# Patient Record
Sex: Male | Born: 1970 | Race: White | Hispanic: No | Marital: Married | Smoking: Never smoker
Health system: Southern US, Community
[De-identification: ages and names within clinical notes are randomized; demographics above are authoritative.]

## PROBLEM LIST (undated history)

## (undated) ENCOUNTER — Emergency Department (HOSPITAL_COMMUNITY): Admission: EM | Payer: Federal, State, Local not specified - PPO | Source: Home / Self Care

---

## 2016-05-16 DIAGNOSIS — K080 Exfoliation of teeth due to systemic causes: Secondary | ICD-10-CM | POA: Diagnosis not present

## 2016-06-12 DIAGNOSIS — Z7189 Other specified counseling: Secondary | ICD-10-CM | POA: Diagnosis not present

## 2016-06-12 DIAGNOSIS — Z23 Encounter for immunization: Secondary | ICD-10-CM | POA: Diagnosis not present

## 2016-10-13 DIAGNOSIS — Z0000 Encounter for general adult medical examination without abnormal findings: Secondary | ICD-10-CM | POA: Diagnosis not present

## 2016-10-13 DIAGNOSIS — E782 Mixed hyperlipidemia: Secondary | ICD-10-CM | POA: Diagnosis not present

## 2016-11-23 DIAGNOSIS — K080 Exfoliation of teeth due to systemic causes: Secondary | ICD-10-CM | POA: Diagnosis not present

## 2017-07-16 DIAGNOSIS — K080 Exfoliation of teeth due to systemic causes: Secondary | ICD-10-CM | POA: Diagnosis not present

## 2017-10-15 DIAGNOSIS — Z13220 Encounter for screening for lipoid disorders: Secondary | ICD-10-CM | POA: Diagnosis not present

## 2017-10-15 DIAGNOSIS — Z0000 Encounter for general adult medical examination without abnormal findings: Secondary | ICD-10-CM | POA: Diagnosis not present

## 2018-02-25 DIAGNOSIS — D23121 Other benign neoplasm of skin of left upper eyelid, including canthus: Secondary | ICD-10-CM | POA: Diagnosis not present

## 2018-02-25 DIAGNOSIS — H2513 Age-related nuclear cataract, bilateral: Secondary | ICD-10-CM | POA: Diagnosis not present

## 2018-03-29 DIAGNOSIS — D23121 Other benign neoplasm of skin of left upper eyelid, including canthus: Secondary | ICD-10-CM | POA: Diagnosis not present

## 2018-03-29 DIAGNOSIS — R238 Other skin changes: Secondary | ICD-10-CM | POA: Diagnosis not present

## 2018-03-29 DIAGNOSIS — H11442 Conjunctival cysts, left eye: Secondary | ICD-10-CM | POA: Diagnosis not present

## 2018-04-05 DIAGNOSIS — D23121 Other benign neoplasm of skin of left upper eyelid, including canthus: Secondary | ICD-10-CM | POA: Diagnosis not present

## 2018-07-15 DIAGNOSIS — K080 Exfoliation of teeth due to systemic causes: Secondary | ICD-10-CM | POA: Diagnosis not present

## 2018-10-16 DIAGNOSIS — Z0000 Encounter for general adult medical examination without abnormal findings: Secondary | ICD-10-CM | POA: Diagnosis not present

## 2018-10-16 DIAGNOSIS — E782 Mixed hyperlipidemia: Secondary | ICD-10-CM | POA: Diagnosis not present

## 2018-10-16 DIAGNOSIS — Z131 Encounter for screening for diabetes mellitus: Secondary | ICD-10-CM | POA: Diagnosis not present

## 2018-10-16 DIAGNOSIS — Z125 Encounter for screening for malignant neoplasm of prostate: Secondary | ICD-10-CM | POA: Diagnosis not present

## 2018-10-16 DIAGNOSIS — Z13220 Encounter for screening for lipoid disorders: Secondary | ICD-10-CM | POA: Diagnosis not present

## 2020-06-30 DIAGNOSIS — D3131 Benign neoplasm of right choroid: Secondary | ICD-10-CM | POA: Diagnosis not present

## 2020-10-30 ENCOUNTER — Encounter (HOSPITAL_COMMUNITY)
Admission: EM | Disposition: A | Discharge status: Home Health | Payer: Self-pay | Source: Home / Self Care | Attending: Orthopedic Surgery

## 2020-10-30 ENCOUNTER — Emergency Department (HOSPITAL_COMMUNITY): Payer: Federal, State, Local not specified - PPO

## 2020-10-30 ENCOUNTER — Encounter (HOSPITAL_COMMUNITY): Payer: Self-pay

## 2020-10-30 ENCOUNTER — Inpatient Hospital Stay (HOSPITAL_COMMUNITY): Payer: Federal, State, Local not specified - PPO

## 2020-10-30 ENCOUNTER — Emergency Department (HOSPITAL_COMMUNITY): Payer: Federal, State, Local not specified - PPO | Admitting: Anesthesiology

## 2020-10-30 ENCOUNTER — Inpatient Hospital Stay (HOSPITAL_COMMUNITY)
Admission: EM | Admit: 2020-10-30 | Discharge: 2020-11-05 | DRG: 488 | Disposition: A | Discharge status: Home Health | Payer: Federal, State, Local not specified - PPO | Attending: Orthopedic Surgery | Admitting: Orthopedic Surgery

## 2020-10-30 ENCOUNTER — Other Ambulatory Visit: Payer: Self-pay

## 2020-10-30 DIAGNOSIS — Y9339 Activity, other involving climbing, rappelling and jumping off: Secondary | ICD-10-CM

## 2020-10-30 DIAGNOSIS — S82142A Displaced bicondylar fracture of left tibia, initial encounter for closed fracture: Secondary | ICD-10-CM | POA: Diagnosis present

## 2020-10-30 DIAGNOSIS — T148XXA Other injury of unspecified body region, initial encounter: Secondary | ICD-10-CM

## 2020-10-30 DIAGNOSIS — Z20822 Contact with and (suspected) exposure to covid-19: Secondary | ICD-10-CM | POA: Diagnosis present

## 2020-10-30 DIAGNOSIS — D62 Acute posthemorrhagic anemia: Secondary | ICD-10-CM | POA: Diagnosis not present

## 2020-10-30 DIAGNOSIS — S83289A Other tear of lateral meniscus, current injury, unspecified knee, initial encounter: Secondary | ICD-10-CM | POA: Diagnosis present

## 2020-10-30 DIAGNOSIS — S83122A Posterior subluxation of proximal end of tibia, left knee, initial encounter: Secondary | ICD-10-CM | POA: Diagnosis not present

## 2020-10-30 DIAGNOSIS — E8889 Other specified metabolic disorders: Secondary | ICD-10-CM | POA: Diagnosis present

## 2020-10-30 DIAGNOSIS — R000 Tachycardia, unspecified: Secondary | ICD-10-CM | POA: Diagnosis not present

## 2020-10-30 DIAGNOSIS — R339 Retention of urine, unspecified: Secondary | ICD-10-CM | POA: Diagnosis not present

## 2020-10-30 DIAGNOSIS — I10 Essential (primary) hypertension: Secondary | ICD-10-CM

## 2020-10-30 DIAGNOSIS — S99912A Unspecified injury of left ankle, initial encounter: Secondary | ICD-10-CM | POA: Diagnosis not present

## 2020-10-30 DIAGNOSIS — W010XXA Fall on same level from slipping, tripping and stumbling without subsequent striking against object, initial encounter: Secondary | ICD-10-CM | POA: Diagnosis present

## 2020-10-30 DIAGNOSIS — S83282A Other tear of lateral meniscus, current injury, left knee, initial encounter: Secondary | ICD-10-CM | POA: Diagnosis not present

## 2020-10-30 DIAGNOSIS — W19XXXA Unspecified fall, initial encounter: Secondary | ICD-10-CM | POA: Diagnosis not present

## 2020-10-30 DIAGNOSIS — E559 Vitamin D deficiency, unspecified: Secondary | ICD-10-CM | POA: Diagnosis not present

## 2020-10-30 DIAGNOSIS — Z01818 Encounter for other preprocedural examination: Secondary | ICD-10-CM | POA: Diagnosis not present

## 2020-10-30 DIAGNOSIS — R338 Other retention of urine: Secondary | ICD-10-CM | POA: Diagnosis not present

## 2020-10-30 DIAGNOSIS — S82142D Displaced bicondylar fracture of left tibia, subsequent encounter for closed fracture with routine healing: Secondary | ICD-10-CM | POA: Diagnosis not present

## 2020-10-30 DIAGNOSIS — Z9889 Other specified postprocedural states: Secondary | ICD-10-CM

## 2020-10-30 DIAGNOSIS — Z419 Encounter for procedure for purposes other than remedying health state, unspecified: Secondary | ICD-10-CM

## 2020-10-30 DIAGNOSIS — S82112A Displaced fracture of left tibial spine, initial encounter for closed fracture: Secondary | ICD-10-CM | POA: Diagnosis not present

## 2020-10-30 HISTORY — PX: EXTERNAL FIXATION LEG: SHX1549

## 2020-10-30 LAB — CBC WITH DIFFERENTIAL/PLATELET
Abs Immature Granulocytes: 0.04 10*3/uL (ref 0.00–0.07)
Basophils Absolute: 0.1 10*3/uL (ref 0.0–0.1)
Basophils Relative: 0 %
Eosinophils Absolute: 0 10*3/uL (ref 0.0–0.5)
Eosinophils Relative: 0 %
HCT: 45.6 % (ref 39.0–52.0)
Hemoglobin: 15.6 g/dL (ref 13.0–17.0)
Immature Granulocytes: 0 %
Lymphocytes Relative: 13 %
Lymphs Abs: 1.8 10*3/uL (ref 0.7–4.0)
MCH: 29.1 pg (ref 26.0–34.0)
MCHC: 34.2 g/dL (ref 30.0–36.0)
MCV: 85.1 fL (ref 80.0–100.0)
Monocytes Absolute: 0.6 10*3/uL (ref 0.1–1.0)
Monocytes Relative: 5 %
Neutro Abs: 10.8 10*3/uL — ABNORMAL HIGH (ref 1.7–7.7)
Neutrophils Relative %: 82 %
Platelets: 256 10*3/uL (ref 150–400)
RBC: 5.36 MIL/uL (ref 4.22–5.81)
RDW: 12.7 % (ref 11.5–15.5)
WBC: 13.3 10*3/uL — ABNORMAL HIGH (ref 4.0–10.5)
nRBC: 0 % (ref 0.0–0.2)

## 2020-10-30 LAB — BASIC METABOLIC PANEL
Anion gap: 12 (ref 5–15)
BUN: 15 mg/dL (ref 6–20)
CO2: 26 mmol/L (ref 22–32)
Calcium: 9 mg/dL (ref 8.9–10.3)
Chloride: 99 mmol/L (ref 98–111)
Creatinine, Ser: 0.92 mg/dL (ref 0.61–1.24)
GFR, Estimated: >60 mL/min (ref >60)
Glucose, Bld: 139 mg/dL — ABNORMAL HIGH (ref 70–99)
Potassium: 4 mmol/L (ref 3.5–5.1)
Sodium: 137 mmol/L (ref 135–145)

## 2020-10-30 LAB — RESP PANEL BY RT-PCR (FLU A&B, COVID) ARPGX2
Influenza A by PCR: NEGATIVE
Influenza B by PCR: NEGATIVE
SARS Coronavirus 2 by RT PCR: NEGATIVE

## 2020-10-30 SURGERY — EXTERNAL FIXATION, LOWER EXTREMITY
Anesthesia: General | Laterality: Left

## 2020-10-30 MED ORDER — DEXAMETHASONE SODIUM PHOSPHATE 10 MG/ML IJ SOLN
INTRAMUSCULAR | Status: DC | PRN
  Administered 2020-10-30: 5 mg via INTRAVENOUS

## 2020-10-30 MED ORDER — MIDAZOLAM HCL 5 MG/5ML IJ SOLN
INTRAMUSCULAR | Status: DC | PRN
  Administered 2020-10-30: 2 mg via INTRAVENOUS

## 2020-10-30 MED ORDER — MORPHINE SULFATE (PF) 4 MG/ML IV SOLN
4.0000 mg | Freq: Once | INTRAVENOUS | Status: AC | PRN
Start: 2020-10-30 — End: 2020-10-30
  Administered 2020-10-30: 4 mg via INTRAVENOUS
  Filled 2020-10-30: qty 1

## 2020-10-30 MED ORDER — SUCCINYLCHOLINE CHLORIDE 20 MG/ML IJ SOLN
INTRAMUSCULAR | Status: DC | PRN
  Administered 2020-10-30: 120 mg via INTRAVENOUS

## 2020-10-30 MED ORDER — FENTANYL CITRATE (PF) 100 MCG/2ML IJ SOLN
INTRAMUSCULAR | Status: DC | PRN
  Administered 2020-10-30 (×5): 50 ug via INTRAVENOUS
  Administered 2020-10-31: 25 ug via INTRAVENOUS
  Administered 2020-10-31: 50 ug via INTRAVENOUS
  Administered 2020-10-31: 25 ug via INTRAVENOUS

## 2020-10-30 MED ORDER — FENTANYL CITRATE (PF) 100 MCG/2ML IJ SOLN: 25.0000 ug | INTRAMUSCULAR | Status: DC | PRN

## 2020-10-30 MED ORDER — FENTANYL CITRATE (PF) 100 MCG/2ML IJ SOLN
50.0000 ug | Freq: Once | INTRAMUSCULAR | Status: AC
  Administered 2020-10-30: 50 ug via INTRAVENOUS
  Filled 2020-10-30: qty 2

## 2020-10-30 MED ORDER — PROPOFOL 10 MG/ML IV BOLUS
INTRAVENOUS | Status: DC | PRN
  Administered 2020-10-30: 200 mg via INTRAVENOUS

## 2020-10-30 MED ORDER — FENTANYL CITRATE (PF) 250 MCG/5ML IJ SOLN
INTRAMUSCULAR | Status: AC
  Filled 2020-10-30: qty 5

## 2020-10-30 MED ORDER — ROCURONIUM BROMIDE 10 MG/ML (PF) SYRINGE
PREFILLED_SYRINGE | INTRAVENOUS | Status: DC | PRN
  Administered 2020-10-30: 60 mg via INTRAVENOUS

## 2020-10-30 MED ORDER — OXYCODONE HCL 5 MG/5ML PO SOLN: 5.0000 mg | Freq: Once | ORAL | Status: DC | PRN

## 2020-10-30 MED ORDER — PROMETHAZINE HCL 25 MG/ML IJ SOLN: 6.2500 mg | INTRAMUSCULAR | Status: DC | PRN

## 2020-10-30 MED ORDER — KETOROLAC TROMETHAMINE 30 MG/ML IJ SOLN: 30.0000 mg | Freq: Once | INTRAMUSCULAR | Status: DC

## 2020-10-30 MED ORDER — PROPOFOL 10 MG/ML IV BOLUS
INTRAVENOUS | Status: AC
  Filled 2020-10-30: qty 20

## 2020-10-30 MED ORDER — LIDOCAINE 2% (20 MG/ML) 5 ML SYRINGE
INTRAMUSCULAR | Status: DC | PRN
  Administered 2020-10-30: 60 mg via INTRAVENOUS

## 2020-10-30 MED ORDER — OXYCODONE HCL 5 MG PO TABS: 5.0000 mg | ORAL_TABLET | Freq: Once | ORAL | Status: DC | PRN

## 2020-10-30 MED ORDER — MIDAZOLAM HCL 2 MG/2ML IJ SOLN
INTRAMUSCULAR | Status: AC
  Filled 2020-10-30: qty 2

## 2020-10-30 MED ORDER — DEXMEDETOMIDINE (PRECEDEX) IN NS 20 MCG/5ML (4 MCG/ML) IV SYRINGE
PREFILLED_SYRINGE | INTRAVENOUS | Status: DC | PRN
  Administered 2020-10-30: 12 ug via INTRAVENOUS
  Administered 2020-10-30: 8 ug via INTRAVENOUS

## 2020-10-30 MED ORDER — ONDANSETRON HCL 4 MG/2ML IJ SOLN
INTRAMUSCULAR | Status: DC | PRN
  Administered 2020-10-30: 4 mg via INTRAVENOUS

## 2020-10-30 MED ORDER — CEFAZOLIN SODIUM-DEXTROSE 2-4 GM/100ML-% IV SOLN
2.0000 g | INTRAVENOUS | Status: AC
  Administered 2020-10-30: 2 g via INTRAVENOUS
  Filled 2020-10-30: qty 100

## 2020-10-30 MED ORDER — ACETAMINOPHEN 10 MG/ML IV SOLN: 1000.0000 mg | Freq: Once | INTRAVENOUS | Status: DC | PRN

## 2020-10-30 MED ORDER — SUGAMMADEX SODIUM 200 MG/2ML IV SOLN
INTRAVENOUS | Status: DC | PRN
  Administered 2020-10-30: 200 mg via INTRAVENOUS

## 2020-10-30 MED ORDER — LACTATED RINGERS IV SOLN: INTRAVENOUS | Status: DC | PRN

## 2020-10-30 SURGICAL SUPPLY — 46 items
BANDAGE ESMARK 6X9 LF (GAUZE/BANDAGES/DRESSINGS) ×1 IMPLANT
BAR GLASS FIBER EXFX 11X500 (EXFIX) ×4 IMPLANT
BNDG COHESIVE 6X5 TAN STRL LF (GAUZE/BANDAGES/DRESSINGS) IMPLANT
BNDG ELASTIC 4X5.8 VLCR STR LF (GAUZE/BANDAGES/DRESSINGS) ×2 IMPLANT
BNDG ELASTIC 6X15 VLCR STRL LF (GAUZE/BANDAGES/DRESSINGS) ×2 IMPLANT
BNDG ELASTIC 6X5.8 VLCR STR LF (GAUZE/BANDAGES/DRESSINGS) ×2 IMPLANT
BNDG ESMARK 6X9 LF (GAUZE/BANDAGES/DRESSINGS) ×2
BNDG GAUZE ELAST 4 BULKY (GAUZE/BANDAGES/DRESSINGS) ×4 IMPLANT
BRUSH SCRUB EZ PLAIN DRY (MISCELLANEOUS) ×4 IMPLANT
COVER SURGICAL LIGHT HANDLE (MISCELLANEOUS) ×4 IMPLANT
DRAPE C-ARM 42X72 X-RAY (DRAPES) ×2 IMPLANT
DRAPE C-ARMOR (DRAPES) ×2 IMPLANT
DRAPE U-SHAPE 47X51 STRL (DRAPES) ×2 IMPLANT
DRSG ADAPTIC 3X8 NADH LF (GAUZE/BANDAGES/DRESSINGS) ×2 IMPLANT
ELECT REM PT RETURN 9FT ADLT (ELECTROSURGICAL) ×2
ELECTRODE REM PT RTRN 9FT ADLT (ELECTROSURGICAL) ×1 IMPLANT
EVACUATOR 1/8 PVC DRAIN (DRAIN) IMPLANT
GAUZE SPONGE 4X4 12PLY STRL (GAUZE/BANDAGES/DRESSINGS) ×2 IMPLANT
GLOVE BIO SURGEON STRL SZ7.5 (GLOVE) ×2 IMPLANT
GLOVE BIO SURGEON STRL SZ8 (GLOVE) ×2 IMPLANT
GLOVE BIOGEL PI IND STRL 7.5 (GLOVE) ×1 IMPLANT
GLOVE BIOGEL PI INDICATOR 7.5 (GLOVE) ×1
GLOVE SRG 8 PF TXTR STRL LF DI (GLOVE) ×1 IMPLANT
GLOVE SURG UNDER POLY LF SZ8 (GLOVE) ×1
GOWN STRL REUS W/ TWL LRG LVL3 (GOWN DISPOSABLE) ×2 IMPLANT
GOWN STRL REUS W/ TWL XL LVL3 (GOWN DISPOSABLE) ×1 IMPLANT
GOWN STRL REUS W/TWL LRG LVL3 (GOWN DISPOSABLE) ×2
GOWN STRL REUS W/TWL XL LVL3 (GOWN DISPOSABLE) ×1
KIT BASIN OR (CUSTOM PROCEDURE TRAY) ×2 IMPLANT
KIT TURNOVER KIT B (KITS) ×2 IMPLANT
MANIFOLD NEPTUNE II (INSTRUMENTS) ×2 IMPLANT
NEEDLE 18GX1X1/2 (RX/OR ONLY) (NEEDLE) ×2 IMPLANT
NEEDLE 22X1 1/2 (OR ONLY) (NEEDLE) IMPLANT
NS IRRIG 1000ML POUR BTL (IV SOLUTION) ×2 IMPLANT
PACK ORTHO EXTREMITY (CUSTOM PROCEDURE TRAY) ×2 IMPLANT
PAD ARMBOARD 7.5X6 YLW CONV (MISCELLANEOUS) ×4 IMPLANT
PAD CAST 4YDX4 CTTN HI CHSV (CAST SUPPLIES) ×1 IMPLANT
PADDING CAST COTTON 4X4 STRL (CAST SUPPLIES) ×1
PADDING CAST COTTON 6X4 STRL (CAST SUPPLIES) ×4 IMPLANT
PIN CLAMP 2BAR 75MM BLUE (EXFIX) ×4 IMPLANT
PIN HALF YELLOW 5X160X35 (EXFIX) ×8 IMPLANT
SPONGE LAP 18X18 RF (DISPOSABLE) ×2 IMPLANT
SYR 20CC LL (SYRINGE) ×2 IMPLANT
TOWEL GREEN STERILE (TOWEL DISPOSABLE) ×4 IMPLANT
TOWEL GREEN STERILE FF (TOWEL DISPOSABLE) ×4 IMPLANT
UNDERPAD 30X36 HEAVY ABSORB (UNDERPADS AND DIAPERS) ×4 IMPLANT

## 2020-10-30 NOTE — Anesthesia Preprocedure Evaluation (Addendum)
Anesthesia Evaluation  Patient identified by MRN, date of birth, ID band Patient awake    Reviewed: Allergy & Precautions, NPO status , Patient's Chart, lab work & pertinent test results  Airway Mallampati: II  TM Distance: >3 FB Neck ROM: Full    Dental no notable dental hx.    Pulmonary neg pulmonary ROS,    Pulmonary exam normal breath sounds clear to auscultation       Cardiovascular negative cardio ROS Normal cardiovascular exam Rhythm:Regular Rate:Normal     Neuro/Psych negative neurological ROS  negative psych ROS   GI/Hepatic negative GI ROS, Neg liver ROS,   Endo/Other  negative endocrine ROS  Renal/GU negative Renal ROS     Musculoskeletal negative musculoskeletal ROS (+)   Abdominal   Peds  Hematology negative hematology ROS (+)   Anesthesia Other Findings LEFT KNEE PAIN  Reproductive/Obstetrics                            Anesthesia Physical Anesthesia Plan  ASA: II  Anesthesia Plan: General   Post-op Pain Management:    Induction: Intravenous  PONV Risk Score and Plan: 2 and Ondansetron, Dexamethasone, Midazolam and Treatment may vary due to age or medical condition  Airway Management Planned: Oral ETT  Additional Equipment:   Intra-op Plan:   Post-operative Plan: Extubation in OR  Informed Consent: I have reviewed the patients History and Physical, chart, labs and discussed the procedure including the risks, benefits and alternatives for the proposed anesthesia with the patient or authorized representative who has indicated his/her understanding and acceptance.     Dental advisory given  Plan Discussed with: CRNA  Anesthesia Plan Comments:         Anesthesia Quick Evaluation

## 2020-10-30 NOTE — H&P (Signed)
[facility] H&P/Consult     Patient ID: Antonio Harrison MRN: [id number] DOB/AGE: Sep 06, 1970 50 y.o.  Chief Complaint: Left tibial plateau fracture with joint subluxation HPI: Antonio Harrison is an 50 y.o. male. Who fell going to [facility] concert general admission seating after jumping a guardrail with acute pain and swelling, but no paresthesia. No other injury. Fentanyl helped, mild reaction to morphine upon administration with flushing but then it resolved. In knee immobilizer. No history of other knee injury.  History reviewed. No past medical history. Borderline hypertension unmedicated.  History reviewed. No past surgical history.  History reviewed. No pertinent family history. Social History:  reports that he has never smoked. He has never used smokeless tobacco. No history on file for alcohol use and drug use. Works for [employer], has been able to do so remotely for the last two years.  Allergies: No Known Allergies  Meds: None  Results for orders placed or performed during the hospital encounter of 10/30/20 (from the past 48 hour(s))  Basic metabolic panel     Status: Abnormal   Collection Time: 10/30/20  6:32 PM  Result Value Ref Range   Sodium 137 135 - 145 mmol/L   Potassium 4.0 3.5 - 5.1 mmol/L   Chloride 99 98 - 111 mmol/L   CO2 26 22 - 32 mmol/L   Glucose, Bld 139 (H) 70 - 99 mg/dL    Comment: Glucose reference range applies only to samples taken after fasting for at least 8 hours.   BUN 15 6 - 20 mg/dL   Creatinine, Ser 4.43 0.61 - 1.24 mg/dL   Calcium 9.0 8.9 - 15.4 mg/dL   GFR, Estimated >00 >86 mL/min    Comment: (NOTE) Calculated using the CKD-EPI Creatinine Equation (2021)    Anion gap 12 5 - 15    Comment: Performed at [facility], [address], [place], [place] [address]  CBC with Differential     Status: Abnormal   Collection Time: 10/30/20  6:32 PM  Result Value Ref Range   WBC 13.3 (H)  4.0 - 10.5 K/uL   RBC 5.36 4.22 - 5.81 MIL/uL   Hemoglobin 15.6 13.0 - 17.0 g/dL   HCT 09.3 26.7 - 12.4 %   MCV 85.1 80.0 - 100.0 fL   MCH 29.1 26.0 - 34.0 pg   MCHC 34.2 30.0 - 36.0 g/dL   RDW 58.0 99.8 - 33.8 %   Platelets 256 150 - 400 K/uL   nRBC 0.0 0.0 - 0.2 %   Neutrophils Relative % 82 %   Neutro Abs 10.8 (H) 1.7 - 7.7 K/uL   Lymphocytes Relative 13 %   Lymphs Abs 1.8 0.7 - 4.0 K/uL   Monocytes Relative 5 %   Monocytes Absolute 0.6 0.1 - 1.0 K/uL   Eosinophils Relative 0 %   Eosinophils Absolute 0.0 0.0 - 0.5 K/uL   Basophils Relative 0 %   Basophils Absolute 0.1 0.0 - 0.1 K/uL   Immature Granulocytes 0 %   Abs Immature Granulocytes 0.04 0.00 - 0.07 K/uL    Comment: Performed at [facility], [address] [place], [place] [address]  Resp Panel by RT-PCR (Flu A&B, Covid) Nasopharyngeal Swab     Status: None   Collection Time: 10/30/20  6:32 PM   Specimen: Nasopharyngeal Swab; Nasopharyngeal(NP) swabs in vial transport medium  Result Value Ref Range   SARS Coronavirus 2 by RT PCR NEGATIVE NEGATIVE  Comment: (NOTE) SARS-CoV-2 target nucleic acids are NOT DETECTED.  The SARS-CoV-2 RNA is generally detectable in upper respiratory specimens during the acute phase of infection. The lowest concentration of SARS-CoV-2 viral copies this assay can detect is 138 copies/mL. A negative result does not preclude SARS-Cov-2 infection and should not be used as the sole basis for treatment or other patient management decisions. A negative result may occur with  improper specimen collection/handling, submission of specimen other than nasopharyngeal swab, presence of viral mutation(s) within the areas targeted by this assay, and inadequate number of viral copies(<138 copies/mL). A negative result must be combined with clinical observations, patient history, and epidemiological information. The expected result is Negative.  Fact Sheet for Patients:   BloggerCourse.com  Fact Sheet for Healthcare Providers:  SeriousBroker.it  This test is no t yet approved or cleared by the [place] FDA and  has been authorized for detection and/or diagnosis of SARS-CoV-2 by FDA under an Emergency Use Authorization (EUA). This EUA will remain  in effect (meaning this test can be used) for the duration of the COVID-19 declaration under Section 564(b)(1) of the Act, 21 U.S.C.section 360bbb-3(b)(1), unless the authorization is terminated  or revoked sooner.       Influenza A by PCR NEGATIVE NEGATIVE   Influenza B by PCR NEGATIVE NEGATIVE    Comment: (NOTE) The Xpert Xpress SARS-CoV-2/FLU/RSV plus assay is intended as an aid in the diagnosis of influenza from Nasopharyngeal swab specimens and should not be used as a sole basis for treatment. Nasal washings and aspirates are unacceptable for Xpert Xpress SARS-CoV-2/FLU/RSV testing.  Fact Sheet for Patients: BloggerCourse.com  Fact Sheet for Healthcare Providers: SeriousBroker.it  This test is not yet approved or cleared by the [place] FDA and has been authorized for detection and/or diagnosis of SARS-CoV-2 by FDA under an Emergency Use Authorization (EUA). This EUA will remain in effect (meaning this test can be used) for the duration of the COVID-19 declaration under Section 564(b)(1) of the Act, 21 U.S.C. section 360bbb-3(b)(1), unless the authorization is terminated or revoked.  Performed at [facility], [address], [place], [place] [address]    DG Chest 1 View  Result Date: 10/30/2020 CLINICAL DATA:  Preop EXAM: CHEST  1 VIEW COMPARISON:  None. FINDINGS: The cardiomediastinal silhouette is normal in contour. No pleural effusion. No pneumothorax. No acute pleuroparenchymal abnormality. Visualized abdomen is unremarkable. No acute osseous abnormality noted.  IMPRESSION: No acute cardiopulmonary abnormality. Electronically Signed   By: Meda Klinefelter MD   On: 10/30/2020 18:36   DG Tibia/Fibula Left  Result Date: 10/30/2020 CLINICAL DATA:  lept over guardrail and dropped approx 5 feet and fell onto left leg. EXAM: LEFT KNEE - COMPLETE 4+ VIEW; LEFT ANKLE COMPLETE - 3+ VIEW; LEFT TIBIA AND FIBULA - 2 VIEW COMPARISON:  None. FINDINGS: Left knee/tibia and fibula: Markedly comminuted lateral tibial plateau fracture extending to the intercondylar eminence and medial condyle. Associated posterolaterally displaced fracture that extends to the lateral metadiaphysis of the tibia. Associated lipohemarthrosis. Left ankle: No evidence of fracture, dislocation, or joint effusion. No evidence of arthropathy or other focal bone abnormality. Soft tissues are unremarkable. IMPRESSION: 1. Markedly comminuted and displaced tibial plateau fracture involving bilateral tibial condyles and the intercondylar eminence. 2. No acute displaced fracture or dislocation of the distal left tibia fibula and bones of the ankle. Electronically Signed   By: Tish Frederickson M.D.   On: 10/30/2020 17:48   DG Ankle Complete Left  Result Date:  10/30/2020 CLINICAL DATA:  lept over guardrail and dropped approx 5 feet and fell onto left leg. EXAM: LEFT KNEE - COMPLETE 4+ VIEW; LEFT ANKLE COMPLETE - 3+ VIEW; LEFT TIBIA AND FIBULA - 2 VIEW COMPARISON:  None. FINDINGS: Left knee/tibia and fibula: Markedly comminuted lateral tibial plateau fracture extending to the intercondylar eminence and medial condyle. Associated posterolaterally displaced fracture that extends to the lateral metadiaphysis of the tibia. Associated lipohemarthrosis. Left ankle: No evidence of fracture, dislocation, or joint effusion. No evidence of arthropathy or other focal bone abnormality. Soft tissues are unremarkable. IMPRESSION: 1. Markedly comminuted and displaced tibial plateau fracture involving bilateral tibial condyles and  the intercondylar eminence. 2. No acute displaced fracture or dislocation of the distal left tibia fibula and bones of the ankle. Electronically Signed   By: Tish Frederickson M.D.   On: 10/30/2020 17:48   DG Knee Complete 4 Views Left  Result Date: 10/30/2020 CLINICAL DATA:  lept over guardrail and dropped approx 5 feet and fell onto left leg. EXAM: LEFT KNEE - COMPLETE 4+ VIEW; LEFT ANKLE COMPLETE - 3+ VIEW; LEFT TIBIA AND FIBULA - 2 VIEW COMPARISON:  None. FINDINGS: Left knee/tibia and fibula: Markedly comminuted lateral tibial plateau fracture extending to the intercondylar eminence and medial condyle. Associated posterolaterally displaced fracture that extends to the lateral metadiaphysis of the tibia. Associated lipohemarthrosis. Left ankle: No evidence of fracture, dislocation, or joint effusion. No evidence of arthropathy or other focal bone abnormality. Soft tissues are unremarkable. IMPRESSION: 1. Markedly comminuted and displaced tibial plateau fracture involving bilateral tibial condyles and the intercondylar eminence. 2. No acute displaced fracture or dislocation of the distal left tibia fibula and bones of the ankle. Electronically Signed   By: Tish Frederickson M.D.   On: 10/30/2020 17:48    ROS No recent fever, bleeding abnormalities, urologic dysfunction, GI problems, or weight gain. Blood pressure (!) 157/97, pulse 97, temperature 98 F (36.7 C), resp. rate 18, SpO2 96 %. Physical Exam NCAT, very pleasant and calm RRR No audible wheezing or retractions LLE Immobilizer in place  Edema/ swelling moderate  Sens: DPN, SPN, TN intact  Motor: EHL, FHL, and lessor toe ext and flex all intact grossly with 5/5 EHL, ankle extension, and eversion; deferred check of plantar flexion bc pain  Brisk cap refill, warm to touch, DP 2+, PT 2+  Mild calf pain with passive extension and of great and lessor toes   Assessment/Plan  Left tibial plateau fracture with severe comminution and joint  subluxation Will reassess compartments intra-op, including measurements if concerning; given his active movement and limited rest pain anticipate further observation at this point.  Plan:  1. Spanning external fixation, compression, ice, elevation  2. CT scan  3. Serial examination of compartments, soft tissue swelling  4. Mobilize with PT   I discussed with the patient the risks and benefits of surgery, including the possibility of infection, nerve injury, vessel injury, wound breakdown, arthritis, symptomatic hardware, DVT/ PE, loss of motion, malunion, nonunion, the possibility of compartment syndrome and the need for releases with subsequent closure, and need for further surgery among others.  We also specifically discussed the need to stage surgery because of the elevated risk of soft tissue breakdown that could lead to amputation.  He acknowledged these risks and wished to proceed.   Myrene Galas, MD [facility], [facility] [phone]  10/30/2020, 10:10 PM  [facility] [address] [place] [address] [phone] (F)

## 2020-10-30 NOTE — ED Notes (Signed)
[facility] present for pt transfer to [facility]. Report given by this nurse

## 2020-10-30 NOTE — Op Note (Signed)
05/29/2019  6:19 PM  PATIENT:  Antonio Harrison  50 y.o.  PRE-OPERATIVE DIAGNOSIS:  Left bicondylar tibial plateau fracture including posterior shear component and joint subluxation  POST-OPERATIVE DIAGNOSIS: Left bicondylar tibial plateau fracture including posterior shear component and joint subluxation  PROCEDURE:  Procedure(s): 1. CLOSED REDUCTION OF LEFT TIBIAL PLATEAU 2. APPLICATION OF SPANNING EXTERNAL FIXATOR LEFT KNEE 3. ASPIRATION OF LEFT KNEE JOINT  SURGEON:  Surgeon(s) and Role:    * Myrene Galas, MD - Primary  PHYSICIAN ASSISTANT: Montez Morita, PA-C  ANESTHESIA:   general  EBL:  none   BLOOD ADMINISTERED:none  DRAINS: none   LOCAL MEDICATIONS USED:  NONE  SPECIMEN:  No Specimen  DISPOSITION OF SPECIMEN:  N/A  COUNTS:  YES  TOURNIQUET:  * No tourniquets in log *  DICTATION: .Note written in EPIC  PLAN OF CARE: Admit to inpatient   PATIENT DISPOSITION:  PACU - hemodynamically stable.   Delay start of Pharmacological VTE agent (>24hrs) due to surgical blood loss or risk of bleeding: no  BRIEF SUMMARY OF INDICATIONS FOR PROCEDURE:  Antonio Harrison is a 50 y.o. -year-old who sustained knee injury from jumping off a guard rail with severe impacted and displaced fractures of the tibial plateau and shaft. The patient was seen and evaluated preoperatively where substantial swelling and pain with passive stretch was noted. I discussed with him the possibility of compartment release as well as spanning external fixation with delayed definitive internal fixation.  Risks discussed include pin tract infection, the need for staged procedures, heart attack, stroke, anesthetic reaction, possible need for skin grafting, malunion, nonunion, infection, and multiple others.  Specifically, we discussed potential for limb loss in the event of deep infection, particularly given poorly controlled diabetes and 2 pack-a-day smoking.  BRIEF SUMMARY OF PROCEDURE:  After  administration of preoperative antibiotics, the patient was taken to the operating room where general anesthesia was induced. The left lower extremity was prepped and draped in usual sterile fashion while carefully holding traction.  After time-out, C-arm was brought in.  Appropriate starting points were identified on the femur and tibia using a clamp for proper spacing and orientation.  Small stab incisions were made.  Tonsil clamp used to spread down to bone.  The pins were advanced to the far cortex.  These were then checked with lateral flouro images to confirm appropriate position just into the far cortex.  After applying and securing clamps on the femur and the tibia, two bars were placed and a closed manipulation performed of both the tibial plateau and the shaft area restoring appropriate length, alignment, and rotation.  Once this was confirmed with AP and lateral C-arm images, my assistant terminally tightened all of the clamps. We dressed the pin sites with Kerlix and applied a sterile gently compressive dressing from foot to thigh.   Following reduction and fixator application, his compartments were noted to be soft and compressible with further decrease in concern for compartment syndrome.   We then turned our attention to the large knee hemarthrosis, which contributed to soft tissue swelling and internal pressure on the skin. Using an 18 gauge needle and 30 cc syringe, 90 cc of sanguinous fluid was aspirated from the knee.  Sterile gently compressive dressings were then applied from foot to ankle using two Ace wraps. An assistant was absolutely necessary as I had to produce and control the reduction while an assistant adjusted and tightened the clamps.  PROGNOSIS:  The patient will need  to return to the OR for definitive treatment. CT scan will be ordered for surgical planning and DVT prophylaxis initiated. The magnitude of articular and soft tissue injury also increases the risks of  arthritis and soft tissue complications including infection.   Doralee Albino. Carola Frost, M.D.

## 2020-10-30 NOTE — ED Provider Notes (Signed)
[facility]-EMERGENCY DEPT Provider Note   CSN: [id number] Arrival date & time: 10/30/20  5726     History Chief Complaint  Patient presents with  . Leg Injury  . Fall    Antonio Harrison is a 50 y.o. male without significant past medical history, presenting the emergency department with complaint of left knee injury that occurred prior to arrival.  Patient states he was at a concert and he jumped over a guardrail.  He states he landed wrong and fell to the ground.  He was unable to bear weight since then with pain in the knee.  Pain is worse with any movement.  He feels more comfortable at rest.  Have some pain radiating down his leg to his ankle.  He has no pain in his hip.  He had no other injuries.  Did not hit his head.  Not on anticoagulation.  Last meal was 11 AM today.  The history is provided by the patient.       History reviewed. No pertinent past medical history.  There are no problems to display for this patient.   History reviewed. No pertinent surgical history.     History reviewed. No pertinent family history.  Social History   Tobacco Use  . Smoking status: Never Smoker  . Smokeless tobacco: Never Used    Home Medications Prior to Admission medications   Not on File    Allergies    Patient has no known allergies.  Review of Systems   Review of Systems  All other systems reviewed and are negative.   Physical Exam Updated Vital Signs BP (!) 169/106   Pulse 91   Temp 97.9 F (36.6 C) (Oral)   Resp 16   SpO2 97%   Physical Exam Vitals and nursing note reviewed.  Constitutional:      Appearance: He is well-developed and well-nourished.  HENT:     Head: Normocephalic and atraumatic.  Eyes:     Conjunctiva/sclera: Conjunctivae normal.  Cardiovascular:     Rate and Rhythm: Normal rate and regular rhythm.  Pulmonary:     Effort: Pulmonary effort is normal. No respiratory distress.     Breath sounds: Normal breath  sounds.  Abdominal:     Palpations: Abdomen is soft.  Musculoskeletal:     Comments: Left knee with significant swelling extending into the lower leg.  No obvious deformity is noted.  Did not range the knee secondary to pain.  Compartments are soft. Left ankle without deformity.  Some mild swelling noted in the anterolateral aspect though no tenderness is appreciated here.  Achilles is intact.  Movement of the foot causes pain in the knee.  Brisk cap refill, normal distal sensation.  DP pulses intact.  No wounds to the left lower extremity.  Skin:    General: Skin is warm.  Neurological:     Mental Status: He is alert.  Psychiatric:        Mood and Affect: Mood and affect normal.        Behavior: Behavior normal.     ED Results / Procedures / Treatments   Labs (all labs ordered are listed, but only abnormal results are displayed) Labs Reviewed  CBC WITH DIFFERENTIAL/PLATELET - Abnormal; Notable for the following components:      Result Value   WBC 13.3 (*)    Neutro Abs 10.8 (*)    All other components within normal limits  RESP PANEL BY RT-PCR (FLU A&B, COVID)  ARPGX2  BASIC METABOLIC PANEL    EKG None  Radiology DG Chest 1 View  Result Date: 10/30/2020 CLINICAL DATA:  Preop EXAM: CHEST  1 VIEW COMPARISON:  None. FINDINGS: The cardiomediastinal silhouette is normal in contour. No pleural effusion. No pneumothorax. No acute pleuroparenchymal abnormality. Visualized abdomen is unremarkable. No acute osseous abnormality noted. IMPRESSION: No acute cardiopulmonary abnormality. Electronically Signed   By: Meda Klinefelter MD   On: 10/30/2020 18:36   DG Tibia/Fibula Left  Result Date: 10/30/2020 CLINICAL DATA:  lept over guardrail and dropped approx 5 feet and fell onto left leg. EXAM: LEFT KNEE - COMPLETE 4+ VIEW; LEFT ANKLE COMPLETE - 3+ VIEW; LEFT TIBIA AND FIBULA - 2 VIEW COMPARISON:  None. FINDINGS: Left knee/tibia and fibula: Markedly comminuted lateral tibial plateau  fracture extending to the intercondylar eminence and medial condyle. Associated posterolaterally displaced fracture that extends to the lateral metadiaphysis of the tibia. Associated lipohemarthrosis. Left ankle: No evidence of fracture, dislocation, or joint effusion. No evidence of arthropathy or other focal bone abnormality. Soft tissues are unremarkable. IMPRESSION: 1. Markedly comminuted and displaced tibial plateau fracture involving bilateral tibial condyles and the intercondylar eminence. 2. No acute displaced fracture or dislocation of the distal left tibia fibula and bones of the ankle. Electronically Signed   By: Tish Frederickson M.D.   On: 10/30/2020 17:48   DG Ankle Complete Left  Result Date: 10/30/2020 CLINICAL DATA:  lept over guardrail and dropped approx 5 feet and fell onto left leg. EXAM: LEFT KNEE - COMPLETE 4+ VIEW; LEFT ANKLE COMPLETE - 3+ VIEW; LEFT TIBIA AND FIBULA - 2 VIEW COMPARISON:  None. FINDINGS: Left knee/tibia and fibula: Markedly comminuted lateral tibial plateau fracture extending to the intercondylar eminence and medial condyle. Associated posterolaterally displaced fracture that extends to the lateral metadiaphysis of the tibia. Associated lipohemarthrosis. Left ankle: No evidence of fracture, dislocation, or joint effusion. No evidence of arthropathy or other focal bone abnormality. Soft tissues are unremarkable. IMPRESSION: 1. Markedly comminuted and displaced tibial plateau fracture involving bilateral tibial condyles and the intercondylar eminence. 2. No acute displaced fracture or dislocation of the distal left tibia fibula and bones of the ankle. Electronically Signed   By: Tish Frederickson M.D.   On: 10/30/2020 17:48   DG Knee Complete 4 Views Left  Result Date: 10/30/2020 CLINICAL DATA:  lept over guardrail and dropped approx 5 feet and fell onto left leg. EXAM: LEFT KNEE - COMPLETE 4+ VIEW; LEFT ANKLE COMPLETE - 3+ VIEW; LEFT TIBIA AND FIBULA - 2 VIEW COMPARISON:   None. FINDINGS: Left knee/tibia and fibula: Markedly comminuted lateral tibial plateau fracture extending to the intercondylar eminence and medial condyle. Associated posterolaterally displaced fracture that extends to the lateral metadiaphysis of the tibia. Associated lipohemarthrosis. Left ankle: No evidence of fracture, dislocation, or joint effusion. No evidence of arthropathy or other focal bone abnormality. Soft tissues are unremarkable. IMPRESSION: 1. Markedly comminuted and displaced tibial plateau fracture involving bilateral tibial condyles and the intercondylar eminence. 2. No acute displaced fracture or dislocation of the distal left tibia fibula and bones of the ankle. Electronically Signed   By: Tish Frederickson M.D.   On: 10/30/2020 17:48    Procedures Procedures   Medications Ordered in ED Medications  morphine 4 MG/ML injection 4 mg (has no administration in time range)  fentaNYL (SUBLIMAZE) injection 50 mcg (50 mcg Intravenous Given 10/30/20 1742)    ED Course  I have reviewed the triage vital signs and the nursing notes.  Pertinent labs & imaging results that were available during my care of the patient were reviewed by me and considered in my medical decision making (see chart for details).    MDM Rules/Calculators/A&P                          Patient presenting to the ED via EMS from a concert after he leaves over a guard rail and landed wrong.  He states once he landed he fell to the ground and could no longer bear weight on his left leg.  He has significant swelling on evaluation, neurovascular intact, soft compartments.  X-ray is positive for the significantly comminuted and displaced tibial plateau fracture bilateral tibial condyles and intercondylar eminence.  No other acute fractures are identified.  He remained fairly vascularly intact with soft compartments on reevaluation.  His pain is managed well.  Dr. Carola Frost with orthopedics is consulted and requests transfer to  [facility] for operative management tonight.  N.p.o. since 11 AM.  Preop labs and Covid swab is sent. CXR.  Will place an Ace wrap and knee immobilizer for compression and immobilization for transport.  Ice for swelling.  Final Clinical Impression(s) / ED Diagnoses Final diagnoses:  Tibial plateau fracture, left, closed, initial encounter    Rx / DC Orders ED Discharge Orders    None       Robinson, [place] N, PA-C 10/30/20 1916    Gerhard Munch, MD 10/31/20 2208

## 2020-10-30 NOTE — ED Notes (Signed)
[facility] called for pt transport  

## 2020-10-30 NOTE — Anesthesia Procedure Notes (Signed)
Procedure Name: Intubation Date/Time: 10/30/2020 10:27 PM Performed by: Edmonia Caprio, CRNA Pre-anesthesia Checklist: Patient identified, Emergency Drugs available, Suction available, Patient being monitored and Timeout performed Patient Re-evaluated:Patient Re-evaluated prior to induction Oxygen Delivery Method: Circle system utilized Preoxygenation: Pre-oxygenation with 100% oxygen Induction Type: IV induction and Rapid sequence Ventilation: Mask ventilation without difficulty and Oral airway inserted - appropriate to patient size Laryngoscope Size: Miller, 3, Glidescope and 4 Grade View: Grade III Tube type: Oral Tube size: 7.5 mm Number of attempts: 1 Airway Equipment and Method: Stylet and Video-laryngoscopy Placement Confirmation: ETT inserted through vocal cords under direct vision,  positive ETCO2 and breath sounds checked- equal and bilateral Secured at: 23 cm Tube secured with: Tape Dental Injury: Teeth and Oropharynx as per pre-operative assessment  Comments: DL x 1 with Miller 3; only arytenoids seen.  Masked with OA, attempt #2 with glidescope 4, gr 1 view.

## 2020-10-30 NOTE — ED Notes (Signed)
Report called to Lafonda Mosses, RN in [place]

## 2020-10-30 NOTE — ED Notes (Signed)
Ace wrap and knee immobilizer placed on L knee.

## 2020-10-30 NOTE — ED Triage Notes (Signed)
EMS report from a concert, lept over guardrail and dropped approx 5 feet and fell onto left leg. No obvious injury, PMS intact. Pt c/o pain in knee and shin.  BP 164/98 HR 84 RR 20 Sp02 100 RA

## 2020-10-31 ENCOUNTER — Encounter (HOSPITAL_COMMUNITY): Payer: Self-pay | Admitting: Orthopedic Surgery

## 2020-10-31 ENCOUNTER — Inpatient Hospital Stay (HOSPITAL_COMMUNITY): Payer: Federal, State, Local not specified - PPO

## 2020-10-31 DIAGNOSIS — S82142A Displaced bicondylar fracture of left tibia, initial encounter for closed fracture: Secondary | ICD-10-CM | POA: Diagnosis present

## 2020-10-31 DIAGNOSIS — S82142D Displaced bicondylar fracture of left tibia, subsequent encounter for closed fracture with routine healing: Secondary | ICD-10-CM | POA: Diagnosis not present

## 2020-10-31 DIAGNOSIS — R338 Other retention of urine: Secondary | ICD-10-CM

## 2020-10-31 DIAGNOSIS — S82002A Unspecified fracture of left patella, initial encounter for closed fracture: Secondary | ICD-10-CM | POA: Diagnosis not present

## 2020-10-31 HISTORY — DX: Other retention of urine: R33.8

## 2020-10-31 LAB — CBC
HCT: 42.8 % (ref 39.0–52.0)
HCT: 39.7 % (ref 39.0–52.0)
Hemoglobin: 14.4 g/dL (ref 13.0–17.0)
Hemoglobin: 13.7 g/dL (ref 13.0–17.0)
MCH: 28.8 pg (ref 26.0–34.0)
MCH: 28.8 pg (ref 26.0–34.0)
MCHC: 33.6 g/dL (ref 30.0–36.0)
MCHC: 34.5 g/dL (ref 30.0–36.0)
MCV: 85.6 fL (ref 80.0–100.0)
MCV: 83.4 fL (ref 80.0–100.0)
Platelets: 204 10*3/uL (ref 150–400)
Platelets: 214 10*3/uL (ref 150–400)
RBC: 5 MIL/uL (ref 4.22–5.81)
RBC: 4.76 MIL/uL (ref 4.22–5.81)
RDW: 12.8 % (ref 11.5–15.5)
RDW: 12.7 % (ref 11.5–15.5)
WBC: 10.7 10*3/uL — ABNORMAL HIGH (ref 4.0–10.5)
WBC: 9.7 10*3/uL (ref 4.0–10.5)
nRBC: 0 % (ref 0.0–0.2)
nRBC: 0 % (ref 0.0–0.2)

## 2020-10-31 LAB — CREATININE, SERUM
Creatinine, Ser: 0.95 mg/dL (ref 0.61–1.24)
GFR, Estimated: >60 mL/min (ref >60)

## 2020-10-31 LAB — VITAMIN D 25 HYDROXY (VIT D DEFICIENCY, FRACTURES): Vit D, 25-Hydroxy: 20.43 ng/mL — ABNORMAL LOW (ref 30–100)

## 2020-10-31 MED ORDER — PANTOPRAZOLE SODIUM 40 MG PO TBEC
40.0000 mg | DELAYED_RELEASE_TABLET | Freq: Every day | ORAL | Status: DC
  Administered 2020-10-31 – 2020-11-05 (×5): 40 mg via ORAL
  Filled 2020-10-31 (×5): qty 1

## 2020-10-31 MED ORDER — TAMSULOSIN HCL 0.4 MG PO CAPS
0.4000 mg | ORAL_CAPSULE | Freq: Every day | ORAL | Status: DC
  Administered 2020-10-31 – 2020-11-05 (×5): 0.4 mg via ORAL
  Filled 2020-10-31 (×5): qty 1

## 2020-10-31 MED ORDER — ONDANSETRON HCL 4 MG PO TABS: 4.0000 mg | ORAL_TABLET | Freq: Four times a day (QID) | ORAL | Status: DC | PRN

## 2020-10-31 MED ORDER — BISACODYL 5 MG PO TBEC: 5.0000 mg | DELAYED_RELEASE_TABLET | Freq: Every day | ORAL | Status: DC | PRN

## 2020-10-31 MED ORDER — VITAMIN D 25 MCG (1000 UNIT) PO TABS
5000.0000 [IU] | ORAL_TABLET | Freq: Every day | ORAL | Status: DC
  Administered 2020-10-31 – 2020-11-05 (×5): 5000 [IU] via ORAL
  Filled 2020-10-31 (×5): qty 5

## 2020-10-31 MED ORDER — ENOXAPARIN SODIUM 40 MG/0.4ML ~~LOC~~ SOLN
40.0000 mg | Freq: Every day | SUBCUTANEOUS | Status: DC
  Administered 2020-10-31 – 2020-11-03 (×3): 40 mg via SUBCUTANEOUS
  Filled 2020-10-31 (×3): qty 0.4

## 2020-10-31 MED ORDER — ASCORBIC ACID 500 MG PO TABS
1000.0000 mg | ORAL_TABLET | Freq: Every day | ORAL | Status: DC
  Administered 2020-10-31 – 2020-11-05 (×5): 1000 mg via ORAL
  Filled 2020-10-31 (×5): qty 2

## 2020-10-31 MED ORDER — METOCLOPRAMIDE HCL 5 MG/ML IJ SOLN: 5.0000 mg | Freq: Three times a day (TID) | INTRAMUSCULAR | Status: DC | PRN

## 2020-10-31 MED ORDER — POTASSIUM CHLORIDE IN NACL 20-0.9 MEQ/L-% IV SOLN
INTRAVENOUS | Status: DC
  Filled 2020-10-31 (×4): qty 1000

## 2020-10-31 MED ORDER — HYDROCODONE-ACETAMINOPHEN 5-325 MG PO TABS
1.0000 | ORAL_TABLET | Freq: Four times a day (QID) | ORAL | Status: DC | PRN
  Administered 2020-10-31: 2 via ORAL
  Administered 2020-11-02: 1 via ORAL
  Administered 2020-11-03 (×2): 2 via ORAL
  Administered 2020-11-03: 1 via ORAL
  Administered 2020-11-04 – 2020-11-05 (×2): 2 via ORAL
  Filled 2020-10-31: qty 1
  Filled 2020-10-31 (×7): qty 2

## 2020-10-31 MED ORDER — DEXTROSE 5 % IV SOLN
500.0000 mg | Freq: Three times a day (TID) | INTRAVENOUS | Status: DC
  Filled 2020-10-31: qty 5

## 2020-10-31 MED ORDER — MAGNESIUM CITRATE PO SOLN: 1.0000 | Freq: Once | ORAL | Status: DC | PRN

## 2020-10-31 MED ORDER — HYDROMORPHONE HCL 1 MG/ML IJ SOLN: 1.0000 mg | INTRAMUSCULAR | Status: DC | PRN

## 2020-10-31 MED ORDER — POLYETHYLENE GLYCOL 3350 17 G PO PACK
17.0000 g | PACK | Freq: Every day | ORAL | Status: DC
Start: 2020-10-31 — End: 2020-11-05
  Administered 2020-10-31 – 2020-11-05 (×4): 17 g via ORAL
  Filled 2020-10-31 (×5): qty 1

## 2020-10-31 MED ORDER — METHOCARBAMOL 500 MG PO TABS
1000.0000 mg | ORAL_TABLET | Freq: Three times a day (TID) | ORAL | Status: DC
Start: 2020-10-31 — End: 2020-11-05
  Administered 2020-10-31 – 2020-11-05 (×17): 1000 mg via ORAL
  Filled 2020-10-31 (×17): qty 2

## 2020-10-31 MED ORDER — INFLUENZA VAC SPLIT QUAD 0.5 ML IM SUSY: 0.5000 mL | PREFILLED_SYRINGE | INTRAMUSCULAR | Status: DC

## 2020-10-31 MED ORDER — METOCLOPRAMIDE HCL 5 MG PO TABS: 5.0000 mg | ORAL_TABLET | Freq: Three times a day (TID) | ORAL | Status: DC | PRN

## 2020-10-31 MED ORDER — HYDROCODONE-ACETAMINOPHEN 7.5-325 MG PO TABS
1.0000 | ORAL_TABLET | ORAL | Status: DC | PRN
  Administered 2020-10-31 (×2): 2 via ORAL
  Administered 2020-11-01: 1 via ORAL
  Filled 2020-10-31 (×3): qty 2

## 2020-10-31 MED ORDER — CEFAZOLIN SODIUM-DEXTROSE 2-4 GM/100ML-% IV SOLN
2.0000 g | Freq: Four times a day (QID) | INTRAVENOUS | Status: AC
  Administered 2020-10-31 (×3): 2 g via INTRAVENOUS
  Filled 2020-10-31 (×3): qty 100

## 2020-10-31 MED ORDER — ACETAMINOPHEN 325 MG PO TABS
325.0000 mg | ORAL_TABLET | Freq: Four times a day (QID) | ORAL | Status: DC | PRN
  Administered 2020-10-31: 500 mg via ORAL
  Administered 2020-11-04: 650 mg via ORAL
  Filled 2020-10-31: qty 2

## 2020-10-31 MED ORDER — ACETAMINOPHEN 500 MG PO TABS
500.0000 mg | ORAL_TABLET | Freq: Two times a day (BID) | ORAL | Status: AC
  Administered 2020-10-31: 500 mg via ORAL
  Filled 2020-10-31 (×3): qty 1

## 2020-10-31 MED ORDER — ONDANSETRON HCL 4 MG/2ML IJ SOLN: 4.0000 mg | Freq: Four times a day (QID) | INTRAMUSCULAR | Status: DC | PRN

## 2020-10-31 MED ORDER — DOCUSATE SODIUM 100 MG PO CAPS
100.0000 mg | ORAL_CAPSULE | Freq: Two times a day (BID) | ORAL | Status: DC
  Administered 2020-10-31 – 2020-11-05 (×10): 100 mg via ORAL
  Filled 2020-10-31 (×10): qty 1

## 2020-10-31 NOTE — Progress Notes (Signed)
Occupational Therapy Evaluation Patient Details Name: Antonio Harrison MRN: [id number] DOB: 1970/09/25 Today's Date: 10/31/2020    History of Present Illness Antonio Harrison is an 50 y.o. male. Who fell going to [facility] concert general admission seating after jumping a guardrail, sustaining a Left tibial plateau fracture with severe comminution and joint subluxation. Pt now s/p LLE ex fix (NWB LLE).   Clinical Impression   Prior to hospitalization, pt was living with his wife in a 1-level house with 2 STE from garage. Pt was independent with ADLs/ADL mobility/IADLs without use of mobility device. Pt worked from home and lived an active life style. Pt's wife worked part-time. Pt's wife is available to provide PRN supervision/assistance as needed upon d/c home. Today, pt received semi-reclined in bed, pt agreeable to OT eval. Pt presents with intact strength/ROM to RLE and BUEs. Pt currently has an ex fix to LLE (therefore NWB). Currently, pt requires min-mod assist for LLE management at bed level, min assist-min guard for sit>stand from EOB using RW, min guard for stand pivot transfer to chair using RW, max-total assist with LB self-care, and setup-I for UB self-care. Educated pt on RW management, NWB LLE, acute care OT process and beyond, safety awareness, and adaptive ADL strategies. Anticipate LLE ex fix removal early this week, will continue to follow acutely as able. Pt will likely benefit from initial 24/7 at home with [facility] to ensure safety. Pt may be a candidate for short-term rehab depending on function after LLE ex fix removed.      Follow Up Recommendations  Home health OT;Supervision/Assistance - 24 hour;Other (comment) (may need short term rehab once ex fix removed)    Equipment Recommendations  3 in 1 bedside commode;Other (comment) (rolling walker)    Recommendations for Other Services  (None)     Precautions / Restrictions Precautions Precautions: Fall Precaution  Comments: NWB LLE Required Braces or Orthoses: Other Brace (LLE ex fix) Restrictions Weight Bearing Restrictions: Yes LLE Weight Bearing: Non weight bearing Other Position/Activity Restrictions: LLE NWB with ex fix      Mobility Bed Mobility Overal bed mobility: Needs Assistance Bed Mobility: Supine to Sit     Supine to sit: Min assist;Mod assist (min assist to support LLE)     General bed mobility comments: min assist to support LLE ex fix, able to transition from semi-reclined to long sitting using bed railing    Transfers Overall transfer level: Needs assistance Equipment used: Rolling walker (2 wheeled) Transfers: Sit to/from [employer] Sit to Stand: Min assist;Min guard Stand pivot transfers: Min guard     Balance Overall balance assessment: Needs assistance Sitting-balance support: Single extremity supported;Feet supported (RLE supported on ground) Sitting balance-Leahy Scale: Good     Standing balance support: Bilateral upper extremity supported;During functional activity (LLE NWB) Standing balance-Leahy Scale: Fair      ADL either performed or assessed with clinical judgement   ADL Overall ADL's : Needs assistance/impaired Eating/Feeding: Independent Eating/Feeding Details (indicate cue type and reason): sitting in chair Grooming: Wash/dry hands;Wash/dry face;Brushing hair;Oral care;Sitting;Min guard;Standing Grooming Details (indicate cue type and reason): in chair and simulated at sink with RW Upper Body Bathing: Modified independent;Sitting (simulated)   Lower Body Bathing: Moderate assistance;Maximal assistance;Sitting/lateral leans;Sit to/from stand (simulated)   Upper Body Dressing : Sitting;Independent (simulated EOB)   Lower Body Dressing: Maximal assistance;Total assistance;Sitting/lateral leans;Sit to/from stand Lower Body Dressing Details (indicate cue type and reason): for donning bilateral socks sitting EOB Toilet Transfer:  Min guard;Cueing  for safety;Stand-pivot;BSC;RW (cues for line management and RW safety)   Toileting- Clothing Manipulation and Hygiene: Moderate assistance;Minimal assistance;Sitting/lateral lean;Sit to/from stand (foley) Toileting - Clothing Manipulation Details (indicate cue type and reason): simulated using RW Tub/ Shower Transfer:  (not assessed today)   Functional mobility during ADLs: Min guard;Cueing for safety;Rolling walker General ADL Comments: mainly needs assistance with managing LLE ex fix for comfort during transitions     Vision Baseline Vision/History: No visual deficits Patient Visual Report: No change from baseline (readers) Vision Assessment?: No apparent visual deficits     Perception Perception Perception Tested?: No   Praxis Praxis Praxis tested?: Not tested    Pertinent Vitals/Pain Pain Assessment: 0-10 Pain Score: 1  Pain Location: LLE Pain Descriptors / Indicators: Other (Comment) (pulling) Pain Intervention(s): Limited activity within patient's tolerance;Monitored during session;Premedicated before session;Repositioned    Hand Dominance Right   Extremity/Trunk Assessment Upper Extremity Assessment Upper Extremity Assessment: Overall WFL for tasks assessed   Lower Extremity Assessment Lower Extremity Assessment: LLE deficits/detail LLE Deficits / Details: NWB LLE with ex fix LLE: Unable to fully assess due to immobilization LLE Sensation: WNL LLE Coordination: decreased gross motor   Cervical / Trunk Assessment Cervical / Trunk Assessment: Normal   Communication Communication Communication: No difficulties   Cognition Arousal/Alertness: Awake/alert Behavior During Therapy: WFL for tasks assessed/performed Overall Cognitive Status: Within Functional Limits for tasks assessed     General Comments  ex fix to LLE with swelling      Home Living Family/patient expects to be discharged to:: Private residence Living Arrangements:  Spouse/significant other Available Help at Discharge: Family;Available PRN/intermittently (Tues/Wed/Thurs wife works half days) Type of Home: House Home Access: Stairs to enter [employer] of Steps: 1.5 steps Entrance Stairs-Rails: None Home Layout: One level     Bathroom Shower/Tub: Producer, television/film/video: Standard Bathroom Accessibility: No   Home Equipment: Shower seat;Grab bars - tub/shower;Crutches;Hand held shower head;Other (comment) (access to knee scooter)          Prior Functioning/Environment Level of Independence: Independent        Comments: works from home, +drives        OT Problem List: Decreased strength;Decreased range of motion;Decreased activity tolerance;Impaired balance (sitting and/or standing);Decreased knowledge of use of DME or AE;Pain;Increased edema      OT Treatment/Interventions: Self-care/ADL training;Therapeutic exercise;DME and/or AE instruction;Neuromuscular education;Energy conservation;Therapeutic activities    OT Goals(Current goals can be found in the care plan section) Acute Rehab OT Goals Patient Stated Goal: to return home OT Goal Formulation: With patient Time For Goal Achievement: 11/14/20 Potential to Achieve Goals: Good  OT Frequency: Min 2X/week    AM-PAC OT "6 Clicks" Daily Activity     Outcome Measure Help from another person eating meals?: None Help from another person taking care of personal grooming?: A Little Help from another person toileting, which includes using toliet, bedpan, or urinal?: A Lot Help from another person bathing (including washing, rinsing, drying)?: A Lot Help from another person to put on and taking off regular upper body clothing?: None Help from another person to put on and taking off regular lower body clothing?: A Lot 6 Click Score: 17   End of Session Equipment Utilized During Treatment: Gait belt;Rolling walker Nurse Communication: Mobility status;Weight bearing  status  Activity Tolerance: Patient tolerated treatment well Patient left: in chair;with call bell/phone within reach  OT Visit Diagnosis: Unsteadiness on feet (R26.81);Muscle weakness (generalized) (M62.81);Pain Pain - Right/Left: Left Pain - part of body:  Leg                Time: 6812-7517 OT Time Calculation (min): 53 min Charges:  OT General Charges $OT Visit: 1 Visit OT Evaluation $OT Eval Moderate Complexity: 1 Mod OT Treatments $Self Care/Home Management : 23-37 mins  Norris Cross, OTR/L [facility] [phone]   Mechele Claude 10/31/2020, 10:59 AM

## 2020-10-31 NOTE — Evaluation (Signed)
Physical Therapy Evaluation Patient Details Name: Antonio Harrison MRN: [id number] DOB: 07-May-1971 Today's Date: 10/31/2020   History of Present Illness  Antonio Harrison is an 50 y.o. male. Who fell going to [employer] concert general admission seating after jumping a guardrail, sustaining a Left tibial plateau fracture with severe comminution and joint subluxation. Pt now s/p LLE ex fix (NWB LLE).  Clinical Impression   Patient is s/p above surgery resulting in functional limitations due to the deficits listed below (see PT Problem List). Lives in a single level home with a few steps to enter with family; Independent prior to this admission; presents to PT with weight bearing and ROM restricitions, decr functional mobility; Overall managing NWB status well with RW; I anticipate good progress;  Patient will benefit from skilled PT to increase their independence and safety with mobility to allow discharge to the venue listed below.       Follow Up Recommendations Home health PT;Supervision - Intermittent    Equipment Recommendations  Rolling walker with 5" wheels;3in1 (PT)    Recommendations for Other Services       Precautions / Restrictions Precautions Precautions: Fall Precaution Comments: NWB LLE Required Braces or Orthoses: Other Brace (LLE ex fix) Restrictions LLE Weight Bearing: Non weight bearing      Mobility  Bed Mobility Overal bed mobility: Needs Assistance Bed Mobility: Sit to Supine       Sit to supine: Min assist   General bed mobility comments: Min assist to help LLE into bed    Transfers Overall transfer level: Needs assistance Equipment used: Rolling walker (2 wheeled) Transfers: Sit to/from Stand Sit to Stand: Min assist         General transfer comment: Min assist for safety; stood from recliner and from 3in1 over toilet in bathroom  Ambulation/Gait Ambulation/Gait assistance: Min guard Gait Distance (Feet): 20 Feet (to and from  bathroom) Assistive device: Rolling walker (2 wheeled) Gait Pattern/deviations:  (Hopt-to)     General Gait Details: verbal and demo cues to hop less and more support self on RW and ease R foot into advancement; good maintenance of NWB  Stairs            Wheelchair Mobility    Modified Rankin (Stroke Patients Only)       Balance     Sitting balance-Leahy Scale: Good       Standing balance-Leahy Scale: Fair                               Pertinent Vitals/Pain Pain Assessment: 0-10 Pain Score: 4  Pain Location: LLE Pain Descriptors / Indicators: Aching Pain Intervention(s): Monitored during session    Home Living Family/patient expects to be discharged to:: Private residence Living Arrangements: Spouse/significant other Available Help at Discharge: Family;Available PRN/intermittently (Tues/Wed/Thurs wife works half days) Type of Home: House Home Access: Stairs to enter Entrance Stairs-Rails: None Secretary/administrator of Steps: 1.5 steps Home Layout: One level Home Equipment: Shower seat;Grab bars - tub/shower;Crutches;Hand held shower head;Other (comment)      Prior Function Level of Independence: Independent         Comments: works from home, +drives     Hand Dominance   Dominant Hand: Right    Extremity/Trunk Assessment   Upper Extremity Assessment Upper Extremity Assessment: Defer to OT evaluation    Lower Extremity Assessment Lower Extremity Assessment: LLE deficits/detail LLE Deficits / Details: NWB LLE with ex  fix spanning Knee LLE: Unable to fully assess due to immobilization LLE Coordination: decreased gross motor    Cervical / Trunk Assessment Cervical / Trunk Assessment: Normal  Communication   Communication: No difficulties  Cognition Arousal/Alertness: Awake/alert Behavior During Therapy: WFL for tasks assessed/performed Overall Cognitive Status: Within Functional Limits for tasks assessed                                         General Comments General comments (skin integrity, edema, etc.): Took extra time to position LLE optimally in bed    Exercises     Assessment/Plan    PT Assessment Patient needs continued PT services  PT Problem List Decreased strength;Decreased range of motion;Decreased activity tolerance;Decreased balance;Decreased mobility;Decreased knowledge of use of DME;Decreased safety awareness;Decreased knowledge of precautions       PT Treatment Interventions DME instruction;Gait training;Stair training;Functional mobility training;Therapeutic activities;Therapeutic exercise;Balance training;Patient/family education    PT Goals (Current goals can be found in the Care Plan section)  Acute Rehab PT Goals Patient Stated Goal: to return home PT Goal Formulation: With patient Time For Goal Achievement: 11/14/20 Potential to Achieve Goals: Good    Frequency Min 4X/week   Barriers to discharge        Co-evaluation               AM-PAC PT "6 Clicks" Mobility  Outcome Measure Help needed turning from your back to your side while in a flat bed without using bedrails?: A Little Help needed moving from lying on your back to sitting on the side of a flat bed without using bedrails?: A Little Help needed moving to and from a bed to a chair (including a wheelchair)?: A Little Help needed standing up from a chair using your arms (e.g., wheelchair or bedside chair)?: A Little Help needed to walk in hospital room?: A Little Help needed climbing 3-5 steps with a railing? : A Little 6 Click Score: 18    End of Session Equipment Utilized During Treatment: Gait belt Activity Tolerance: Patient tolerated treatment well Patient left: in bed;with call bell/phone within reach;with family/visitor present Nurse Communication: Mobility status PT Visit Diagnosis: Unsteadiness on feet (R26.81);Other abnormalities of gait and mobility (R26.89)    Time: 1448-1856 PT  Time Calculation (min) (ACUTE ONLY): 33 min   Charges:   PT Evaluation $PT Eval Moderate Complexity: 1 Mod PT Treatments $Gait Training: 8-22 mins        Van Clines, PT  Acute Rehabilitation Services Pager [phone] Office [phone]   Levi Aland 10/31/2020, 7:33 PM

## 2020-10-31 NOTE — Progress Notes (Signed)
Called and spoke with lab at 0045 to come draw patient labs. Lad tech was busy in ICU at the moment. Transferred patient to floor and reported off to Floor RN to page lab for patient lab draw CBC and creatinine.

## 2020-10-31 NOTE — Anesthesia Postprocedure Evaluation (Signed)
Anesthesia Post Note  Patient: Antonio Harrison  Procedure(s) Performed: EXTERNAL FIXATION KNEE, FASCIOTOMY (Left )     Patient location during evaluation: PACU Anesthesia Type: General Level of consciousness: awake Pain management: pain level controlled Vital Signs Assessment: post-procedure vital signs reviewed and stable Respiratory status: spontaneous breathing, nonlabored ventilation, respiratory function stable and patient connected to nasal cannula oxygen Cardiovascular status: blood pressure returned to baseline and stable Postop Assessment: no apparent nausea or vomiting Anesthetic complications: no   No complications documented.  Last Vitals:  Vitals:   10/31/20 0111 10/31/20 0140  BP: (!) 156/96 (!) 163/98  Pulse: 88 87  Resp: 14 18  Temp:  36.5 C  SpO2: 95% 94%    Last Pain:  Vitals:   10/31/20 0140  TempSrc: Oral  PainSc:                  Ryan P Ellender

## 2020-10-31 NOTE — Progress Notes (Addendum)
   ORTHOPAEDIC PROGRESS NOTE  s/p Procedure(s): EXTERNAL FIXATION KNEE, FASCIOTOMY left leg for left tibial plateau fracture  SUBJECTIVE: Reports mild pain about operative site. No chest pain. No SOB. No nausea/vomiting. No other complaints.  OBJECTIVE: PE:  Vitals:   10/31/20 0419 10/31/20 1100  BP: (!) 142/93 (!) 151/95  Pulse: 87 (!) 106  Resp: 17 18  Temp: 98 F (36.7 C) 98.2 F (36.8 C)  SpO2: 96% 92%     ASSESSMENT: Antonio Harrison is a 50 y.o. male doing well postoperatively. Principal Problem:   Closed bicondylar fracture of left tibial plateau Active Problems:   Status post surgery for ex fix placement on left leg   Acute urinary retention post op   PLAN: Weightbearing: NWB LLE Insicional and dressing care: Dressings left intact until follow-up Orthopedic device(s): None Showering: no VTE prophylaxis: Lovenox 40mg  qd until 24 hours prior to next procedure Pain control: well controlled Follow - up plan: ORIF planned for Tuesday Contact information:   Fredy Gladu A. Friday Physician Assistant [facility] [phone]  10/31/2020, 11:37 AM Patient ID: 11/[id number]/2020, male   DOB: 1971-08-20, 50 y.o.   MRN: [id number]

## 2020-10-31 NOTE — Plan of Care (Signed)

## 2020-10-31 NOTE — Transfer of Care (Addendum)
Immediate Anesthesia Transfer of Care Note  Patient: Antonio Harrison  Procedure(s) Performed: EXTERNAL FIXATION KNEE, FASCIOTOMY (Left )  Patient Location: PACU  Anesthesia Type:General  Level of Consciousness: awake  Airway & Oxygen Therapy: Patient Spontanous Breathing  Post-op Assessment: Report given to RN  Post vital signs: Reviewed and stable  Last Vitals:  Vitals Value Taken Time  BP    Temp    Pulse    Resp    SpO2      Last Pain:  Vitals:   10/31/20 0140  TempSrc: Oral  PainSc:          Complications: No complications documented.

## 2020-11-01 ENCOUNTER — Encounter (HOSPITAL_COMMUNITY)
Admission: EM | Disposition: A | Discharge status: Home Health | Payer: Self-pay | Source: Home / Self Care | Attending: Orthopedic Surgery

## 2020-11-01 ENCOUNTER — Inpatient Hospital Stay (HOSPITAL_COMMUNITY): Payer: Federal, State, Local not specified - PPO | Admitting: Anesthesiology

## 2020-11-01 ENCOUNTER — Inpatient Hospital Stay (HOSPITAL_COMMUNITY): Payer: Federal, State, Local not specified - PPO

## 2020-11-01 ENCOUNTER — Encounter (HOSPITAL_COMMUNITY): Payer: Self-pay | Admitting: Orthopedic Surgery

## 2020-11-01 DIAGNOSIS — S82142A Displaced bicondylar fracture of left tibia, initial encounter for closed fracture: Secondary | ICD-10-CM | POA: Diagnosis not present

## 2020-11-01 DIAGNOSIS — S82109D Unspecified fracture of upper end of unspecified tibia, subsequent encounter for closed fracture with routine healing: Secondary | ICD-10-CM | POA: Diagnosis not present

## 2020-11-01 DIAGNOSIS — S82112A Displaced fracture of left tibial spine, initial encounter for closed fracture: Secondary | ICD-10-CM | POA: Diagnosis not present

## 2020-11-01 DIAGNOSIS — S83282A Other tear of lateral meniscus, current injury, left knee, initial encounter: Secondary | ICD-10-CM | POA: Diagnosis not present

## 2020-11-01 HISTORY — PX: ORIF TIBIA PLATEAU: SHX2132

## 2020-11-01 LAB — SURGICAL PCR SCREEN
MRSA, PCR: NEGATIVE
Staphylococcus aureus: NEGATIVE

## 2020-11-01 SURGERY — OPEN REDUCTION INTERNAL FIXATION (ORIF) TIBIAL PLATEAU
Anesthesia: General | Site: Knee | Laterality: Left

## 2020-11-01 MED ORDER — LACTATED RINGERS IV SOLN: INTRAVENOUS | Status: DC

## 2020-11-01 MED ORDER — FENTANYL CITRATE (PF) 100 MCG/2ML IJ SOLN
INTRAMUSCULAR | Status: AC
  Administered 2020-11-01: 50 ug via INTRAVENOUS
  Filled 2020-11-01: qty 2

## 2020-11-01 MED ORDER — CEFAZOLIN SODIUM 1 G IJ SOLR
INTRAMUSCULAR | Status: AC
  Filled 2020-11-01: qty 30

## 2020-11-01 MED ORDER — FENTANYL CITRATE (PF) 100 MCG/2ML IJ SOLN: 50.0000 ug | Freq: Once | INTRAMUSCULAR | Status: AC

## 2020-11-01 MED ORDER — CHLORHEXIDINE GLUCONATE 0.12 % MT SOLN: 15.0000 mL | Freq: Once | OROMUCOSAL | Status: AC

## 2020-11-01 MED ORDER — CHLORHEXIDINE GLUCONATE CLOTH 2 % EX PADS
6.0000 | MEDICATED_PAD | Freq: Every day | CUTANEOUS | Status: DC
  Administered 2020-11-04 – 2020-11-05 (×2): 6 via TOPICAL

## 2020-11-01 MED ORDER — 0.9 % SODIUM CHLORIDE (POUR BTL) OPTIME
TOPICAL | Status: DC | PRN
  Administered 2020-11-01: 1000 mL

## 2020-11-01 MED ORDER — LIDOCAINE 2% (20 MG/ML) 5 ML SYRINGE
INTRAMUSCULAR | Status: DC | PRN
  Administered 2020-11-01: 100 mg via INTRAVENOUS

## 2020-11-01 MED ORDER — OXYCODONE HCL 5 MG/5ML PO SOLN: 5.0000 mg | Freq: Once | ORAL | Status: AC | PRN

## 2020-11-01 MED ORDER — ONDANSETRON HCL 4 MG/2ML IJ SOLN
INTRAMUSCULAR | Status: AC
  Filled 2020-11-01: qty 2

## 2020-11-01 MED ORDER — FENTANYL CITRATE (PF) 250 MCG/5ML IJ SOLN
INTRAMUSCULAR | Status: AC
  Filled 2020-11-01: qty 5

## 2020-11-01 MED ORDER — ONDANSETRON HCL 4 MG/2ML IJ SOLN: 4.0000 mg | Freq: Once | INTRAMUSCULAR | Status: DC | PRN

## 2020-11-01 MED ORDER — HYDROMORPHONE HCL 1 MG/ML IJ SOLN
INTRAMUSCULAR | Status: DC | PRN
  Administered 2020-11-01: .5 mg via INTRAVENOUS

## 2020-11-01 MED ORDER — DEXAMETHASONE SODIUM PHOSPHATE 10 MG/ML IJ SOLN
INTRAMUSCULAR | Status: DC | PRN
  Administered 2020-11-01: 10 mg via INTRAVENOUS

## 2020-11-01 MED ORDER — PROPOFOL 10 MG/ML IV BOLUS
INTRAVENOUS | Status: DC | PRN
  Administered 2020-11-01: 150 mg via INTRAVENOUS

## 2020-11-01 MED ORDER — MIDAZOLAM HCL 2 MG/2ML IJ SOLN
INTRAMUSCULAR | Status: AC
  Filled 2020-11-01: qty 2

## 2020-11-01 MED ORDER — KETAMINE HCL 50 MG/5ML IJ SOSY
PREFILLED_SYRINGE | INTRAMUSCULAR | Status: AC
  Filled 2020-11-01: qty 5

## 2020-11-01 MED ORDER — ALBUMIN HUMAN 5 % IV SOLN: INTRAVENOUS | Status: DC | PRN

## 2020-11-01 MED ORDER — LABETALOL HCL 5 MG/ML IV SOLN: 10.0000 mg | Freq: Once | INTRAVENOUS | Status: AC

## 2020-11-01 MED ORDER — ACETAMINOPHEN 10 MG/ML IV SOLN
INTRAVENOUS | Status: DC | PRN
  Administered 2020-11-01: 1000 mg via INTRAVENOUS

## 2020-11-01 MED ORDER — ROCURONIUM BROMIDE 10 MG/ML (PF) SYRINGE
PREFILLED_SYRINGE | INTRAVENOUS | Status: DC | PRN
  Administered 2020-11-01: 30 mg via INTRAVENOUS
  Administered 2020-11-01: 40 mg via INTRAVENOUS
  Administered 2020-11-01: 50 mg via INTRAVENOUS

## 2020-11-01 MED ORDER — CHLORHEXIDINE GLUCONATE 0.12 % MT SOLN
OROMUCOSAL | Status: AC
  Administered 2020-11-01: 15 mL via OROMUCOSAL
  Filled 2020-11-01: qty 15

## 2020-11-01 MED ORDER — DEXTROSE 5 % IV SOLN
3.0000 g | INTRAVENOUS | Status: AC
  Filled 2020-11-01: qty 3000

## 2020-11-01 MED ORDER — CEFAZOLIN SODIUM-DEXTROSE 2-3 GM-%(50ML) IV SOLR
INTRAVENOUS | Status: DC | PRN
  Administered 2020-11-01 (×2): 3 g via INTRAVENOUS

## 2020-11-01 MED ORDER — FENTANYL CITRATE (PF) 100 MCG/2ML IJ SOLN
25.0000 ug | INTRAMUSCULAR | Status: DC | PRN
Start: 2020-11-01 — End: 2020-11-01
  Administered 2020-11-01: 50 ug via INTRAVENOUS

## 2020-11-01 MED ORDER — FENTANYL CITRATE (PF) 250 MCG/5ML IJ SOLN
INTRAMUSCULAR | Status: DC | PRN
  Administered 2020-11-01: 100 ug via INTRAVENOUS
  Administered 2020-11-01 (×3): 50 ug via INTRAVENOUS

## 2020-11-01 MED ORDER — HYDROMORPHONE HCL 1 MG/ML IJ SOLN
1.0000 mg | INTRAMUSCULAR | Status: DC | PRN
  Administered 2020-11-01 – 2020-11-03 (×6): 1 mg via INTRAVENOUS
  Filled 2020-11-01 (×7): qty 1

## 2020-11-01 MED ORDER — VANCOMYCIN HCL 1000 MG IV SOLR
INTRAVENOUS | Status: DC | PRN
  Administered 2020-11-01: 1000 mg via TOPICAL

## 2020-11-01 MED ORDER — METHOCARBAMOL 500 MG PO TABS
ORAL_TABLET | ORAL | Status: AC
  Filled 2020-11-01: qty 2

## 2020-11-01 MED ORDER — LIDOCAINE 2% (20 MG/ML) 5 ML SYRINGE
INTRAMUSCULAR | Status: AC
  Filled 2020-11-01: qty 5

## 2020-11-01 MED ORDER — SUGAMMADEX SODIUM 200 MG/2ML IV SOLN
INTRAVENOUS | Status: DC | PRN
  Administered 2020-11-01: 400 mg via INTRAVENOUS

## 2020-11-01 MED ORDER — ACETAMINOPHEN 10 MG/ML IV SOLN
INTRAVENOUS | Status: AC
  Filled 2020-11-01: qty 100

## 2020-11-01 MED ORDER — OXYCODONE HCL 5 MG PO TABS
ORAL_TABLET | ORAL | Status: AC
  Filled 2020-11-01: qty 1

## 2020-11-01 MED ORDER — HYDROCODONE-ACETAMINOPHEN 7.5-325 MG PO TABS
1.0000 | ORAL_TABLET | Freq: Four times a day (QID) | ORAL | Status: DC | PRN
  Administered 2020-11-02 – 2020-11-05 (×5): 2 via ORAL
  Filled 2020-11-01 (×5): qty 2

## 2020-11-01 MED ORDER — HYDROMORPHONE HCL 1 MG/ML IJ SOLN
INTRAMUSCULAR | Status: AC
  Filled 2020-11-01: qty 0.5

## 2020-11-01 MED ORDER — VANCOMYCIN HCL 1000 MG IV SOLR
INTRAVENOUS | Status: AC
  Filled 2020-11-01: qty 1000

## 2020-11-01 MED ORDER — ESMOLOL HCL 100 MG/10ML IV SOLN
INTRAVENOUS | Status: AC
  Filled 2020-11-01: qty 10

## 2020-11-01 MED ORDER — MIDAZOLAM HCL 5 MG/5ML IJ SOLN
INTRAMUSCULAR | Status: DC | PRN
  Administered 2020-11-01: 2 mg via INTRAVENOUS

## 2020-11-01 MED ORDER — DEXMEDETOMIDINE (PRECEDEX) IN NS 20 MCG/5ML (4 MCG/ML) IV SYRINGE
PREFILLED_SYRINGE | INTRAVENOUS | Status: DC | PRN
  Administered 2020-11-01: 12 ug via INTRAVENOUS
  Administered 2020-11-01: 8 ug via INTRAVENOUS
  Administered 2020-11-01: 20 ug via INTRAVENOUS

## 2020-11-01 MED ORDER — ONDANSETRON HCL 4 MG/2ML IJ SOLN
INTRAMUSCULAR | Status: DC | PRN
  Administered 2020-11-01: 4 mg via INTRAVENOUS

## 2020-11-01 MED ORDER — OXYCODONE HCL 5 MG PO TABS
5.0000 mg | ORAL_TABLET | Freq: Once | ORAL | Status: AC | PRN
  Administered 2020-11-01: 5 mg via ORAL

## 2020-11-01 MED ORDER — PROPOFOL 1000 MG/100ML IV EMUL
INTRAVENOUS | Status: AC
  Filled 2020-11-01: qty 100

## 2020-11-01 MED ORDER — CEFAZOLIN SODIUM-DEXTROSE 2-4 GM/100ML-% IV SOLN
2.0000 g | Freq: Three times a day (TID) | INTRAVENOUS | Status: AC
  Administered 2020-11-02 (×3): 2 g via INTRAVENOUS
  Filled 2020-11-01 (×3): qty 100

## 2020-11-01 MED ORDER — LABETALOL HCL 5 MG/ML IV SOLN
INTRAVENOUS | Status: AC
  Administered 2020-11-01: 10 mg via INTRAVENOUS
  Filled 2020-11-01: qty 4

## 2020-11-01 SURGICAL SUPPLY — 104 items
BANDAGE ESMARK 6X9 LF (GAUZE/BANDAGES/DRESSINGS) ×1 IMPLANT
BIT DRILL 100X2.5XANTM LCK (BIT) ×1 IMPLANT
BIT DRILL 2.5X2.75 QC CALB (BIT) ×2 IMPLANT
BIT DRILL CAL (BIT) ×1 IMPLANT
BIT DRILL CALIBR QC 2.5X250 (BIT) ×2 IMPLANT
BIT DRILL PERC QC 2.8X200 100 (BIT) ×1 IMPLANT
BIT DRILL QC 3.5X110 (BIT) ×2 IMPLANT
BIT DRL 100X2.5XANTM LCK (BIT) ×1
BLADE CLIPPER SURG (BLADE) IMPLANT
BLADE SURG 10 STRL SS (BLADE) ×2 IMPLANT
BLADE SURG 15 STRL LF DISP TIS (BLADE) ×1 IMPLANT
BLADE SURG 15 STRL SS (BLADE) ×1
BNDG COHESIVE 4X5 TAN STRL (GAUZE/BANDAGES/DRESSINGS) ×2 IMPLANT
BNDG ELASTIC 4X5.8 VLCR STR LF (GAUZE/BANDAGES/DRESSINGS) ×2 IMPLANT
BNDG ELASTIC 6X10 VLCR STRL LF (GAUZE/BANDAGES/DRESSINGS) ×2 IMPLANT
BNDG ELASTIC 6X5.8 VLCR STR LF (GAUZE/BANDAGES/DRESSINGS) ×2 IMPLANT
BNDG ESMARK 6X9 LF (GAUZE/BANDAGES/DRESSINGS) ×2
BNDG GAUZE ELAST 4 BULKY (GAUZE/BANDAGES/DRESSINGS) ×4 IMPLANT
BONE CANC CHIPS 40CC CAN1/2 (Bone Implant) ×4 IMPLANT
BRUSH SCRUB EZ PLAIN DRY (MISCELLANEOUS) ×4 IMPLANT
CANISTER SUCT 3000ML PPV (MISCELLANEOUS) ×2 IMPLANT
CHIPS CANC BONE 40CC CAN1/2 (Bone Implant) ×2 IMPLANT
COVER SURGICAL LIGHT HANDLE (MISCELLANEOUS) ×2 IMPLANT
COVER WAND RF STERILE (DRAPES) ×2 IMPLANT
CUFF TOURN SGL QUICK 34 (TOURNIQUET CUFF) ×1
CUFF TRNQT CYL 34X4.125X (TOURNIQUET CUFF) ×1 IMPLANT
DRAPE C-ARM 42X72 X-RAY (DRAPES) ×2 IMPLANT
DRAPE C-ARMOR (DRAPES) ×2 IMPLANT
DRAPE HALF SHEET 40X57 (DRAPES) IMPLANT
DRAPE INCISE IOBAN 66X45 STRL (DRAPES) ×2 IMPLANT
DRAPE U-SHAPE 47X51 STRL (DRAPES) ×2 IMPLANT
DRILL BIT 2.5MM (BIT) ×1
DRILL BIT CAL (BIT) ×2
DRILL BIT QUICK COUP 2.8MM 100 (BIT) ×1
DRSG ADAPTIC 3X8 NADH LF (GAUZE/BANDAGES/DRESSINGS) ×2 IMPLANT
DRSG MEPILEX BORDER 4X4 (GAUZE/BANDAGES/DRESSINGS) ×2 IMPLANT
DRSG MEPITEL 4X7.2 (GAUZE/BANDAGES/DRESSINGS) ×4 IMPLANT
DRSG PAD ABDOMINAL 8X10 ST (GAUZE/BANDAGES/DRESSINGS) ×2 IMPLANT
ELECT REM PT RETURN 9FT ADLT (ELECTROSURGICAL) ×2
ELECTRODE REM PT RTRN 9FT ADLT (ELECTROSURGICAL) ×1 IMPLANT
GAUZE SPONGE 4X4 12PLY STRL (GAUZE/BANDAGES/DRESSINGS) ×4 IMPLANT
GLOVE BIO SURGEON STRL SZ7.5 (GLOVE) ×2 IMPLANT
GLOVE BIO SURGEON STRL SZ8 (GLOVE) ×2 IMPLANT
GLOVE BIOGEL PI IND STRL 7.5 (GLOVE) ×1 IMPLANT
GLOVE BIOGEL PI INDICATOR 7.5 (GLOVE) ×1
GLOVE SRG 8 PF TXTR STRL LF DI (GLOVE) ×1 IMPLANT
GLOVE SURG UNDER POLY LF SZ8 (GLOVE) ×1
GOWN STRL REUS W/ TWL LRG LVL3 (GOWN DISPOSABLE) ×2 IMPLANT
GOWN STRL REUS W/ TWL XL LVL3 (GOWN DISPOSABLE) ×1 IMPLANT
GOWN STRL REUS W/TWL LRG LVL3 (GOWN DISPOSABLE) ×2
GOWN STRL REUS W/TWL XL LVL3 (GOWN DISPOSABLE) ×1
IMMOBILIZER KNEE 22 UNIV (SOFTGOODS) ×2 IMPLANT
K-WIRE ACE 1.6X6 (WIRE) ×10
KIT BASIN OR (CUSTOM PROCEDURE TRAY) ×2 IMPLANT
KIT INFUSE LRG II (Orthopedic Implant) ×2 IMPLANT
KIT TURNOVER KIT B (KITS) ×2 IMPLANT
KWIRE ACE 1.6X6 (WIRE) ×5 IMPLANT
NDL SUT 6 .5 CRC .975X.05 MAYO (NEEDLE) IMPLANT
NEEDLE MAYO TAPER (NEEDLE)
NS IRRIG 1000ML POUR BTL (IV SOLUTION) ×2 IMPLANT
PACK ORTHO EXTREMITY (CUSTOM PROCEDURE TRAY) ×2 IMPLANT
PAD ABD 8X10 STRL (GAUZE/BANDAGES/DRESSINGS) ×6 IMPLANT
PAD ARMBOARD 7.5X6 YLW CONV (MISCELLANEOUS) ×4 IMPLANT
PAD CAST 4YDX4 CTTN HI CHSV (CAST SUPPLIES) ×1 IMPLANT
PADDING CAST COTTON 4X4 STRL (CAST SUPPLIES) ×1
PADDING CAST COTTON 6X4 STRL (CAST SUPPLIES) ×2 IMPLANT
PLATE LOCK 7H STD LT PROX TIB (Plate) ×2 IMPLANT
PLATE TIB PROX 3.5X69 1H (Plate) ×2 IMPLANT
SCREW CORTEX 3.5 50MM (Screw) ×2 IMPLANT
SCREW CORTEX 3.5 55MM (Screw) ×2 IMPLANT
SCREW CORTEX 3.5 60MM (Screw) ×2 IMPLANT
SCREW CORTEX 3.5 65MM (Screw) ×2 IMPLANT
SCREW CORTICAL 3.5MM  42MM (Screw) ×1 IMPLANT
SCREW CORTICAL 3.5MM 36MM (Screw) ×2 IMPLANT
SCREW CORTICAL 3.5MM 40MM (Screw) ×2 IMPLANT
SCREW CORTICAL 3.5MM 42MM (Screw) ×1 IMPLANT
SCREW CORTICAL 3.5MM 50MM (Screw) ×2 IMPLANT
SCREW CORTICAL 3.5MM 70MM (Screw) ×2 IMPLANT
SCREW LOCK 3.5X80 DIST TIB (Screw) ×10 IMPLANT
SCREW LOCK CORT STAR 3.5X80 (Screw) ×2 IMPLANT
SCREW LOCK CORT STAR 3.5X85 (Screw) ×4 IMPLANT
SCREW LOCK T15 FT 60X3.5X2.9X (Screw) ×2 IMPLANT
SCREW LOCKING 3.5X60 (Screw) ×2 IMPLANT
SCREW LP 3.5X85MM (Screw) ×2 IMPLANT
SPONGE LAP 18X18 RF (DISPOSABLE) ×2 IMPLANT
STAPLER VISISTAT 35W (STAPLE) ×2 IMPLANT
STOCKINETTE IMPERVIOUS LG (DRAPES) ×2 IMPLANT
SUCTION FRAZIER HANDLE 10FR (MISCELLANEOUS) ×1
SUCTION TUBE FRAZIER 10FR DISP (MISCELLANEOUS) ×1 IMPLANT
SUT ETHILON 2 0 PSLX (SUTURE) ×4 IMPLANT
SUT ETHILON 3 0 PS 1 (SUTURE) IMPLANT
SUT PROLENE 0 CT 2 (SUTURE) ×4 IMPLANT
SUT VIC AB 0 CT1 27 (SUTURE) ×1
SUT VIC AB 0 CT1 27XBRD ANBCTR (SUTURE) ×1 IMPLANT
SUT VIC AB 1 CT1 27 (SUTURE) ×1
SUT VIC AB 1 CT1 27XBRD ANBCTR (SUTURE) ×1 IMPLANT
SUT VIC AB 2-0 CT1 27 (SUTURE) ×3
SUT VIC AB 2-0 CT1 TAPERPNT 27 (SUTURE) ×3 IMPLANT
TOWEL GREEN STERILE (TOWEL DISPOSABLE) ×4 IMPLANT
TOWEL GREEN STERILE FF (TOWEL DISPOSABLE) ×2 IMPLANT
TRAY FOLEY MTR SLVR 16FR STAT (SET/KITS/TRAYS/PACK) IMPLANT
TUBE CONNECTING 12X1/4 (SUCTIONS) ×2 IMPLANT
WATER STERILE IRR 1000ML POUR (IV SOLUTION) ×4 IMPLANT
YANKAUER SUCT BULB TIP NO VENT (SUCTIONS) ×2 IMPLANT

## 2020-11-01 NOTE — Transfer of Care (Signed)
Immediate Anesthesia Transfer of Care Note  Patient: Antonio Harrison  Procedure(s) Performed: OPEN REDUCTION INTERNAL FIXATION (ORIF) TIBIAL PLATEAU. REPAIR OF EMINENCE LEFT KNEE AND REMOVAL OF EXTERNAL FIXATOR (Left Knee)  Patient Location: PACU  Anesthesia Type:General  Level of Consciousness: awake, alert  and patient cooperative  Airway & Oxygen Therapy: Patient Spontanous Breathing  Post-op Assessment: Report given to RN and Post -op Vital signs reviewed and stable  Post vital signs: Reviewed and stable  Last Vitals:  Vitals Value Taken Time  BP 153/92 11/01/20 1901  Temp    Pulse 104 11/01/20 1902  Resp 16 11/01/20 1902  SpO2 96 % 11/01/20 1902  Vitals shown include unvalidated device data.  Last Pain:  Vitals:   11/01/20 1357  TempSrc:   PainSc: 2          Complications: No complications documented.

## 2020-11-01 NOTE — Progress Notes (Signed)
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  No pain with passive stretching of toes or ankle  Extremity is warm  + DP pulse.  Good perfusion distally  DPN, SPN, TN sensory function intact  EHL, FHL, lesser toe motor function intact.  Ankle flexion, extension, inversion eversion grossly intact  Assessment and plan  50 year old male with complex left bicondylar tibial plateau fracture  Soft tissue appears to be in relatively good condition and suitable to proceed with surgery today He will be nonweightbearing left leg for 8 weeks postop Unrestricted range of motion postop, will obtain hinged knee brace PT and OT  eval postop DC Foley on postop day 1 Advance bowel regimen, has not had a bowel movement since Saturday morning.  He states that he goes daily  Patient at high risk for posttraumatic arthritis given pattern No antecedent knee pain or knee issues prior to his injury  Risks and benefits of surgery reviewed with patient and he wishes to proceed  [name], PA-C [phone] (C) 11/01/2020, 9:23 AM  [facility] [address] [place] [address] [phone] [name] (F)

## 2020-11-01 NOTE — Plan of Care (Signed)

## 2020-11-01 NOTE — Op Note (Addendum)
6:39 PM 11/01/2020  PATIENT:  Antonio Harrison  50 y.o. male  [id number]  PRE-OPERATIVE DIAGNOSIS:  1. LEFT BICONDYLAR TIBIAL PLATEAU FRACTURE 2. LEFT TIBIAL EMINENCE/ SPINE FRACTURE 3. SUSPECTED LATERAL MENISCUS TEAR 4. RETAINED EXTERNAL FIXATOR   POST-OPERATIVE DIAGNOSIS:   1. LEFT BICONDYLAR TIBIAL PLATEAU FRACTURE 2. LEFT TIBIAL EMINENCE/ SPINE FRACTURE 3. SUSPECTED LATERAL MENISCUS TEAR 4. RETAINED EXTERNAL FIXATOR   PROCEDURE:   1. ORIF OF LEFT BICONDYLAR TIBIAL PLATEAU FRACTURE  2. ORIF TIBIAL EMINENCE 3. ARTHROTOMY WITH LATERAL MENISCECTOMY 4. ANTERIOR COMPARTMENT FASCIOTOMY 5. REMOVAL OF EXTERNAL FIXATOR UNDER ANESTHESIA 6. MANUAL APPLICATION OF STRESS UNDER FLUOROSCOPY  SURGEON:  Surgeon(s) and Role:    Myrene Galas, MD - Primary  PHYSICIAN ASSISTANT: Montez Morita, PA-C  ANESTHESIA:   general  I/O:   No intake/output data recorded.  SPECIMEN:  None  TOURNIQUET:  * Missing tourniquet times found for documented tourniquets in log: [id number] *  DICTATION: Written in Epic.  DISPOSITION: PACU  CONDITION: STABLE  BRIEF SUMMARY OF INDICATION FOR PROCEDURE:  The patient is a 50 y.o. who sustained a severely comminuted bicondylar tibial plateau fracture with medial joint line depression and posterior shear, treated provisionally with external fixation to reduce subluxation caused by injury then compression dressing, ice, elevation, and active motion of the foot and toes to facilitate resolution of soft tissue swelling.  I discussed with the patient the risks and benefits of surgical repair, including the potential for arthritis, malunion, nonunion, loss of reduction, DVT, PE, loss of motion, arthritis, nerve injury, vessel injury, symptomatic hardware, compartment syndrome, and need for further surgery, among others.  After acknowledging these risks, consent was provided to proceed.  BRIEF SUMMARY OF PROCEDURE:  The patient was given 3 g of Ancef preoperatively, taken  to the operating room where general anesthesia was induced. The skin was scrubbed with chlorhexidine, followed by shaving of the surgical field.  We did retain the fixator to protect the neurovascular structures during prepping. Once time-out was held, the leg was isolated from the clamps with towels and the fixator removed.   The pin sites were not ulcerated and required no formal debridement.  These were irrigated thoroughly and closed at the end of the case. Additional betadine paint was performed over the intact skin while Ioban was used to isolate pin sites from the surgical field. New drape and gloves were then applied by operative staff.  We performed a medial approach without elevation of the tourniquet, identifying the saphenous nerve proximally and carefully retracting it and the saphenous nerve.  The pes tendons were also mobilized and secured with a Penrose drain after incising the sartorius sheath.  I then was able to encounter the primary exit of the fracture line medially and also develop the fracture posteromedially.  My assistant pulled external rotation and extension of the knee while I used a Cobb to  mobilize the medial segment.  I cleaned the fracture site with curettes and lavage then elevated it into a reduced position. It was buttressed with the synthes posterior tibial plate using standard fixation for buttress then eventually a locked screw proximally as well. X-rays showed what appeared to be anatomic on the medial plateau..  Attention then turned to the lateral plateau.  A curvilinear incision was made extending laterally over Gerdy's tubercle. Dissection was carried down where the soft tissues were left intact to the lateral plateau and rim.  I did incise the retinaculum proximal to the tibial plateau, and then going  along inside the retinaculum performed a submeniscal arthrotomy, releasing the coronary ligament along its insertion onto the tibia. Zero prolene suture was used to  reflect this and inspect the meniscus and joint surface. The joint was irrigated thoroughly and this revealed a complete radial tear through the midbody of the lateral meniscus tear which was not reparable. Consequently, using a 10 blade and rongeur it was excised in its entirety. The joint surface was markedly depressed and comminuted.  We then released some of the anterior extensors to enable the plate to fit along the proximal shaft. The fracture site was booked open. This allowed me to obtain #2 fiberwire suture fixation into the tibial eminence/ spine and to pull it down into a reduced position. I then elevated and pinned much of the lateral plateau into the medial fragment. A small part of the anterior lateral joint rolled out of the knee when it was booked open and could not be secured. Consequently part of the defect was re-established with cancellous autograft protected from the joint with carrier for Infuse allograft. Underneath this area and throughout the metaphysis another 40 cc of cancellous chips were placed into this defect along with the rest of the Infuse (BMP plus carrier) This appeared to re-establish proper joint height and alignment. I then placed the plate laterally and used the [employer] clamp to apply a compressive force across the joint line. This reduced the widened plateau back to the appropriate size.  At this point, we placed standard fixation in the proximal row of the plate followed by locked fixation. This was followed by additional fixation within the shaft. I then tied that suture on the tibial eminence/ spine over the plate as back up fixation though it was also secured between the medial and lateral plateaus. My assistant was careful to control alignment throughout by using traction and bending forces. He also assited with retraction. All wounds were irrigated thoroughly. Final AP and LAT  And stress fluoro images showed restoration of alignment and acceptable implant position,  but without solid resistance to valgus.   Prior to closure, I turned my attention to the distal edge of the wound here underneath the skin.  I used the long scissors to spread both superficial and deep to the anterior compartment.  The fascia was then released for 8 to 10 cm to reduce the likelihood of the postoperative compartment syndrome.  Once more, wound was irrigated and then a standard layered closure performed, 0 Vicryl, 2-0 Vicryl, and 3-0 nylon for the skin.  Sterile gently compressive dressing was applied and in knee immobilizer.  The patient was taken to the PACU in stable condition.  Following this reconstruction of the plateau, I did perform a stress fluoroscopy and did not identify any significant ligamentous laxity after osteosynthesis.  Montez Morita, PA-C, was present and assisting throughout and was necessary to achieve and maintain the reduction and also assisted with provisional instrumentation and closure.    PROGNOSIS:  The patient will have unrestricted range of motion of the knee and ankle, and will be transitioned to a hinged knee brace tomorrow to supply supplemental support to our repair.  Risk for arthritis is significantly elevated given the articular and meniscal injury. Orders entered for nonweightbearing on the operative extremity, pharmacologic DVT prophylaxis, and mobilization with PT and OT. After discharge, we will plan to see the patient back in about 2 weeks for removal of sutures and we will continue to follow throughout the hospital stay.  Doralee Albino. Carola Frost, M.D.

## 2020-11-01 NOTE — Anesthesia Postprocedure Evaluation (Signed)
Anesthesia Post Note  Patient: Antonio Harrison  Procedure(s) Performed: OPEN REDUCTION INTERNAL FIXATION (ORIF) TIBIAL PLATEAU. REPAIR OF EMINENCE LEFT KNEE AND REMOVAL OF EXTERNAL FIXATOR (Left Knee)     Patient location during evaluation: PACU Anesthesia Type: General Level of consciousness: awake and alert Pain management: pain level controlled Vital Signs Assessment: post-procedure vital signs reviewed and stable Respiratory status: spontaneous breathing, nonlabored ventilation, respiratory function stable and patient connected to nasal cannula oxygen Cardiovascular status: blood pressure returned to baseline and stable Postop Assessment: no apparent nausea or vomiting Anesthetic complications: no   No complications documented.  Last Vitals:  Vitals:   11/01/20 2000 11/01/20 2012  BP: (!) 156/104 (!) 153/92  Pulse: 81 78  Resp: 16 14  Temp:  (!) 36.3 C  SpO2: 100% 94%    Last Pain:  Vitals:   11/01/20 1900  TempSrc:   PainSc: Asleep                 Beryle Lathe

## 2020-11-01 NOTE — Anesthesia Preprocedure Evaluation (Signed)
Anesthesia Evaluation  Patient identified by MRN, date of birth, ID band Patient awake    Reviewed: Allergy & Precautions, NPO status , Patient's Chart, lab work & pertinent test results  Airway Mallampati: II  TM Distance: >3 FB Neck ROM: Full    Dental  (+) Teeth Intact, Dental Advisory Given   Pulmonary    breath sounds clear to auscultation       Cardiovascular  Rhythm:Regular Rate:Normal     Neuro/Psych    GI/Hepatic   Endo/Other    Renal/GU      Musculoskeletal   Abdominal   Peds  Hematology   Anesthesia Other Findings   Reproductive/Obstetrics                             Anesthesia Physical Anesthesia Plan  ASA: II  Anesthesia Plan: General   Post-op Pain Management:    Induction: Intravenous  PONV Risk Score and Plan: Ondansetron and Dexamethasone  Airway Management Planned: Oral ETT  Additional Equipment:   Intra-op Plan:   Post-operative Plan: Extubation in OR  Informed Consent: I have reviewed the patients History and Physical, chart, labs and discussed the procedure including the risks, benefits and alternatives for the proposed anesthesia with the patient or authorized representative who has indicated his/her understanding and acceptance.     Dental advisory given  Plan Discussed with: CRNA and Anesthesiologist  Anesthesia Plan Comments: (L. Tibial Plateau fracture  Plan GA with oral ETT  Kipp Brood)        Anesthesia Quick Evaluation

## 2020-11-01 NOTE — Anesthesia Procedure Notes (Signed)
Procedure Name: Intubation Date/Time: 11/01/2020 2:33 PM Performed by: Lytle Michaels, CRNA Pre-anesthesia Checklist: Patient identified, Emergency Drugs available, Suction available and Patient being monitored Patient Re-evaluated:Patient Re-evaluated prior to induction Oxygen Delivery Method: Circle system utilized Preoxygenation: Pre-oxygenation with 100% oxygen Induction Type: IV induction Ventilation: Mask ventilation without difficulty Laryngoscope Size: Miller and 2 Grade View: Grade II Tube type: Oral Tube size: 7.5 mm Number of attempts: 1 Airway Equipment and Method: Stylet and Oral airway Placement Confirmation: ETT inserted through vocal cords under direct vision,  positive ETCO2 and breath sounds checked- equal and bilateral Tube secured with: Tape Dental Injury: Teeth and Oropharynx as per pre-operative assessment

## 2020-11-02 DIAGNOSIS — S82142A Displaced bicondylar fracture of left tibia, initial encounter for closed fracture: Secondary | ICD-10-CM | POA: Diagnosis not present

## 2020-11-02 HISTORY — PX: OTHER SURGICAL HISTORY: SHX169

## 2020-11-02 LAB — CBC
HCT: 32.2 % — ABNORMAL LOW (ref 39.0–52.0)
Hemoglobin: 11.2 g/dL — ABNORMAL LOW (ref 13.0–17.0)
MCH: 29.9 pg (ref 26.0–34.0)
MCHC: 34.8 g/dL (ref 30.0–36.0)
MCV: 85.9 fL (ref 80.0–100.0)
Platelets: 181 10*3/uL (ref 150–400)
RBC: 3.75 MIL/uL — ABNORMAL LOW (ref 4.22–5.81)
RDW: 12.9 % (ref 11.5–15.5)
WBC: 7.9 10*3/uL (ref 4.0–10.5)
nRBC: 0 % (ref 0.0–0.2)

## 2020-11-02 NOTE — Progress Notes (Signed)
Physical Therapy Treatment Patient Details Name: Antonio Harrison MRN: [id number] DOB: Mar 07, 1971 Today's Date: 11/02/2020    History of Present Illness Antonio Harrison is an 50 y.o. male. Who fell going to [facility] concert general admission seating after jumping a guardrail, sustaining a Left tibial plateau fracture with severe comminution and joint subluxation. Pt now s/p LLE ex fix (NWB LLE).    PT Comments    Continuing work on functional mobility and activity tolerance;  Noting better activity tolerance post removal of ex-fix, and hinged brace seems much more comfortable than KI; Pt is still to be NWB on LLE, and no knee flexion -- OK for isometrics; Overall moving well, though painful; Mod assist to sit up to EOB and min assist for transfers, short distance hop-to amb; Pt and wife with many questions re: managing at home  Follow Up Recommendations  Home health PT;Supervision - Intermittent     Equipment Recommendations  Rolling walker with 5" wheels;3in1 (PT);Wheelchair (measurements PT);Wheelchair cushion (measurements PT)    Recommendations for Other Services       Precautions / Restrictions Precautions Precautions: Fall Precaution Comments: NWB LLE Required Braces or Orthoses: Other Brace (Hinged brace locked in extension) Restrictions LLE Weight Bearing: Non weight bearing    Mobility  Bed Mobility Overal bed mobility: Needs Assistance Bed Mobility: Supine to Sit     Supine to sit: Mod assist     General bed mobility comments: Cues for technqiue to half-bridge hip toward EOB with RLE; demonstrated using a belt for pt to move his LLE in the bed, which was difficult today, but I anticipate he will use well as he recovers; Light mod assist to help move LLE to EOB and support it coming off of the bed; Demonstrated correct posture for helper supporting LLE to his wife; min handheld assist to pull fully to sit    Transfers Overall transfer level: Needs  assistance Equipment used: Rolling walker (2 wheeled) Transfers: Sit to/from Stand Sit to Stand: Min assist         General transfer comment: Min assist to steady RW; good rise with good NWB LLE  Ambulation/Gait Ambulation/Gait assistance: Min guard Gait Distance (Feet): 12 Feet Assistive device: Rolling walker (2 wheeled) Gait Pattern/deviations:  (Hopt-to)     General Gait Details: verbal and demo cues to hop less and more support self on RW and ease R foot into advancement; good maintenance of NWB;l short, guarded steps   Stairs             Wheelchair Mobility    Modified Rankin (Stroke Patients Only)       Balance     Sitting balance-Leahy Scale: Good       Standing balance-Leahy Scale: Poor                              Cognition Arousal/Alertness: Awake/alert Behavior During Therapy: WFL for tasks assessed/performed Overall Cognitive Status: Within Functional Limits for tasks assessed                                        Exercises      General Comments General comments (skin integrity, edema, etc.): Orthostist in for brace fitting      Pertinent Vitals/Pain Pain Assessment: Faces Faces Pain Scale: Hurts even more Pain Location: LLE Pain Descriptors /  Indicators: Aching;Grimacing;Guarding Pain Intervention(s): Monitored during session;Repositioned    Home Living Family/patient expects to be discharged to:: Private residence                    Prior Function            PT Goals (current goals can now be found in the care plan section) Acute Rehab PT Goals PT Goal Formulation: With patient Time For Goal Achievement: 11/14/20 Potential to Achieve Goals: Good Progress towards PT goals: Progressing toward goals    Frequency    Min 4X/week      PT Plan Current plan remains appropriate    Co-evaluation              AM-PAC PT "6 Clicks" Mobility   Outcome Measure  Help needed turning  from your back to your side while in a flat bed without using bedrails?: A Little Help needed moving from lying on your back to sitting on the side of a flat bed without using bedrails?: A Little Help needed moving to and from a bed to a chair (including a wheelchair)?: A Little Help needed standing up from a chair using your arms (e.g., wheelchair or bedside chair)?: None Help needed to walk in hospital room?: A Little Help needed climbing 3-5 steps with a railing? : A Lot 6 Click Score: 18    End of Session Equipment Utilized During Treatment: Gait belt Activity Tolerance: Patient tolerated treatment well Patient left: in chair;with call bell/phone within reach;Other (comment);with nursing/sitter in room (sitting by sink preparing to wash up; call string near) Nurse Communication: Mobility status PT Visit Diagnosis: Unsteadiness on feet (R26.81);Other abnormalities of gait and mobility (R26.89)     Time: 0918-1006 (minus approx 10 min while PA in to see pt) PT Time Calculation (min) (ACUTE ONLY): 48 min  Charges:  $Gait Training: 8-22 mins $Therapeutic Activity: 23-37 mins                     Van Clines, PT  Acute Rehabilitation Services Pager [phone] Office [phone]    Levi Aland 11/02/2020, 1:55 PM

## 2020-11-02 NOTE — TOC Progression Note (Incomplete)
Transition of Care [facility]) - Progression Note    Patient Details  Name: IVORY BAIL MRN: [id number] Date of Birth: [birth year]  Transition of Care [facility]) CM/SW Contact  Epifanio Lesches, RN Phone Number: 11/02/2020, 10:34 AM  Clinical Narrative:       Expected Discharge Plan: Home w Home Health Services Barriers to Discharge: Continued Medical Work up  Expected Discharge Plan and Services Expected Discharge Plan: Home w Home Health Services   Discharge Planning Services: CM Consult                               HH Arranged: PT [facility] Agency: [facility] Date [facility] Agency Contacted: 11/02/20 Time HH Agency Contacted: 1019 Representative spoke with at [facility] Agency: Kandee Keen   Social Determinants of Health (SDOH) Interventions    Readmission Risk Interventions No flowsheet data found.

## 2020-11-02 NOTE — Progress Notes (Signed)
Orthopedic Tech Progress Note Patient Details:  DELAWRENCE FRIDMAN Dec 09, 1970 [id number]  Patient ID: [name], male   DOB: [birth year], 50 y.o.   MRN: [id number] Called in order to [facility].  Trinna Post 11/02/2020, 7:19 AM

## 2020-11-02 NOTE — Progress Notes (Signed)
Orthopaedic Trauma Service Progress Note  Patient ID: Antonio Harrison MRN: [id number] DOB/AGE: [birth year] 50 y.o.  Subjective:  Doing well relatively minimal pain   ROS As above  Objective:   VITALS:   Vitals:   11/01/20 2121 11/02/20 0000 11/02/20 0400 11/02/20 0816  BP: (!) 160/87 138/89 133/81 (!) 156/92  Pulse: 93 93 98 (!) 102  Resp: 16 18 17 18   Temp: 97.6 F (36.4 C) 98.3 F (36.8 C) (!) 100.4 F (38 C) 98.2 F (36.8 C)  TempSrc: Oral Oral Oral   SpO2: 93% 94% 94% 91%  Weight:      Height:        Estimated body mass index is 32.87 kg/m as calculated from the following:   Height as of this encounter: 6\' 4"  (1.93 m).   Weight as of this encounter: 122.5 kg.   Intake/Output      02/28 0701 03/01 0700 03/01 0701 03/02 0700   P.O.  240   I.V. (mL/kg) 2653.2 (21.7)    IV Piggyback 775    Total Intake(mL/kg) 3428.2 (28) 240 (2)   Urine (mL/kg/hr) 1700 (0.6) 1400 (3.9)   Blood 400    Total Output 2100 1400   Net +1328.2 -1160          LABS  Results for orders placed or performed during the hospital encounter of 10/30/20 (from the past 24 hour(s))  CBC     Status: Abnormal   Collection Time: 11/02/20  1:30 AM  Result Value Ref Range   WBC 7.9 4.0 - 10.5 K/uL   RBC 3.75 (L) 4.22 - 5.81 MIL/uL   Hemoglobin 11.2 (L) 13.0 - 17.0 g/dL   HCT 11/01/20 (L) 01/02/21 - 43.1 %   MCV 85.9 80.0 - 100.0 fL   MCH 29.9 26.0 - 34.0 pg   MCHC 34.8 30.0 - 36.0 g/dL   RDW 54.0 08.6 - 76.1 %   Platelets 181 150 - 400 K/uL   nRBC 0.0 0.0 - 0.2 %     PHYSICAL EXAM:   Gen: appears well, family at bedside Lungs: unlabored Cardiac: RRR Abd: + BS, NTND Ext:       Left Lower Extremity   Dressing and KI intact  Ext warm   Minimal swelling  No DCT  Compartments soft, no pain out of proportion with passive stretch   Motor and sensory functions intact    Assessment/Plan: 1 Day Post-Op    Principal Problem:   Closed bicondylar fracture of left tibial plateau Active Problems:   Status post surgery for ex fix placement on left leg   Acute urinary retention post op   Anti-infectives (From admission, onward)   Start     Dose/Rate Route Frequency Ordered Stop   11/02/20 0200  ceFAZolin (ANCEF) IVPB 2g/100 mL premix        2 g 200 mL/hr over 30 Minutes Intravenous Every 8 hours 11/01/20 2051 11/03/20 0159   11/01/20 1644  vancomycin (VANCOCIN) powder  Status:  Discontinued          As needed 11/01/20 1645 11/01/20 1855   11/01/20 1015  ceFAZolin (ANCEF) 3 g in dextrose 5 % 50 mL IVPB        3 g 100 mL/hr over 30 Minutes Intravenous On call to O.R. 11/01/20 11/03/20  11/02/20 0559   10/31/20 0400  ceFAZolin (ANCEF) IVPB 2g/100 mL premix        2 g 200 mL/hr over 30 Minutes Intravenous Every 6 hours 10/31/20 0157 10/31/20 1753   10/30/20 2115  ceFAZolin (ANCEF) IVPB 2g/100 mL premix        2 g 200 mL/hr over 30 Minutes Intravenous On call to O.R. 10/30/20 2056 10/30/20 2232    .  POD/HD#: 1  50 y/o male s/p fall with complex L bicondylar tibial plateau fracture  -comminuted L bicondylar tibial plateau fracture with severe injury to lateral articular surface s/p ORIF and lateral menisectomy   NWB L leg x 8 weeks  Hinged brace but locked in full extension x 2 weeks then begin unrestricted ROM   Ice and elevate  Dressing change tomorrow or Thursday   PT/OT    Given the amount of injury present pt will need TKA in the near future   - Pain management:  Multimodal  - ABL anemia/Hemodynamics  Stable   - Medical issues   Vitamin d deficinecy    Supplement    - DVT/PE prophylaxis:  eliquis x 4 weeks   - ID:   periop abx  - Metabolic Bone Disease:  As above  - Activity:  Up with therapies   - FEN/GI prophylaxis/Foley/Lines:  Reg diet  Dc foley   NSL IVF once taking pos well  - Impediments to fracture healing:  Severe soft tissue envelope injury  -  Dispo:  Therapies  Likely home on Thursday   Arrange HH needs/DME needs       [name], PA-C [phone] (C) 11/02/2020, 9:56 AM  [facility] [address] [place] [address] [phone] [name] (F)    After 5pm and on the weekends please log on to Amion, go to orthopaedics and the look under the [facility] Call for the provider(s) on call. You can also call our office at [phone] and then follow the prompts to be connected to the call team.

## 2020-11-02 NOTE — Progress Notes (Signed)
Pt up in the chair at this time. Per pt to come back around 1330 after therapy. Rn will re consult.

## 2020-11-02 NOTE — Progress Notes (Signed)
Occupational Therapy Treatment Patient Details Name: Antonio Harrison MRN: [id number] DOB: 04/17/1971 Today's Date: 11/02/2020    History of present illness Antonio Harrison is an 50 y.o. male. Who fell going to [facility] concert general admission seating after jumping a guardrail, sustaining a Left tibial plateau fracture with severe comminution and joint subluxation. Pt now s/p LLE ex fix (NWB LLE).   OT comments  Pt making progress with functional goals. Pt in recliner upon arrival and requesting to go to the bathroom. Min assist to steady RW, min guard A ambulating to bathroom;  min A to transfer to 3 in 1 over toilet, pt stood at toilet with RW for posterior hygiene. pt able to maintain NWB LLE during mobility and standing tasks. OT will continue to follow acutely to maximize level of function and safety  Follow Up Recommendations  Home health OT;Supervision/Assistance - 24 hour;Other (comment)    Equipment Recommendations  3 in 1 bedside commode;Other (comment) (RW, reacher)    Recommendations for Other Services      Precautions / Restrictions Precautions Precautions: Fall Precaution Comments: NWB LLE Required Braces or Orthoses: Other Brace Restrictions Weight Bearing Restrictions: Yes LLE Weight Bearing: Non weight bearing       Mobility Bed Mobility Overal bed mobility: Needs Assistance Bed Mobility: Sit to Supine       Sit to supine: Mod assist   General bed mobility comments: mod A with L LE back onto bed and to postion in elevation    Transfers Overall transfer level: Needs assistance Equipment used: Rolling walker (2 wheeled) Transfers: Sit to/from Stand Sit to Stand: Min assist         General transfer comment: Min assist to steady RW, min guard A ambulating to bathroom;  min A to transfer to 3 in 1 over toilet, pt stood at toilet with RW for posterior hygiene. pt able to maintain NWB LLE during mobility and standing tasks    Balance Overall  balance assessment: Needs assistance Sitting-balance support: Single extremity supported;Feet supported Sitting balance-Leahy Scale: Good     Standing balance support: Bilateral upper extremity supported;During functional activity Standing balance-Leahy Scale: Poor                             ADL either performed or assessed with clinical judgement   ADL Overall ADL's : Needs assistance/impaired Eating/Feeding: Independent;Sitting   Grooming: Wash/dry hands;Wash/dry face;Min guard;Standing       Lower Body Bathing: Moderate assistance;Sitting/lateral leans;With caregiver independent assisting       Lower Body Dressing: Maximal assistance;Sitting/lateral leans;Sit to/from stand;With caregiver independent assisting   Toilet Transfer: Min guard;Cueing for safety;RW;Comfort height toilet   Toileting- Clothing Manipulation and Hygiene: Moderate assistance;Total assistance;Sitting/lateral lean;Sit to/from stand;With caregiver independent assisting Toileting - Clothing Manipulation Details (indicate cue type and reason): mod A clothing mgt, total A with posterior hygiene     Functional mobility during ADLs: Minimal assistance;Min guard;Rolling walker General ADL Comments: pt;s wife and dtr present. Pt's wife able to Independently and safely asssit pt to toilet and with hygiene     Vision Baseline Vision/History: No visual deficits Patient Visual Report: No change from baseline     Perception     Praxis      Cognition Arousal/Alertness: Awake/alert Behavior During Therapy: WFL for tasks assessed/performed Overall Cognitive Status: Within Functional Limits for tasks assessed  Exercises     Shoulder Instructions       General Comments Orthostist in for brace fitting    Pertinent Vitals/ Pain       Pain Assessment: Faces Faces Pain Scale: Hurts even more Pain Location: LLE Pain Descriptors /  Indicators: Aching;Grimacing;Guarding Pain Intervention(s): Premedicated before session;Monitored during session;Repositioned  Home Living Family/patient expects to be discharged to:: Private residence Living Arrangements: Spouse/significant other Available Help at Discharge: Family;Available PRN/intermittently Type of Home: House Home Access: Stairs to enter [employer] of Steps: 1.5 steps                              Prior Functioning/Environment              Frequency  Min 2X/week        Progress Toward Goals  OT Goals(current goals can now be found in the care plan section)  Progress towards OT goals: Progressing toward goals  Acute Rehab OT Goals Patient Stated Goal: to return home  Plan Discharge plan remains appropriate;Frequency remains appropriate    Co-evaluation                 AM-PAC OT "6 Clicks" Daily Activity     Outcome Measure   Help from another person eating meals?: None Help from another person taking care of personal grooming?: A Little Help from another person toileting, which includes using toliet, bedpan, or urinal?: A Lot Help from another person bathing (including washing, rinsing, drying)?: A Lot Help from another person to put on and taking off regular upper body clothing?: None Help from another person to put on and taking off regular lower body clothing?: A Lot 6 Click Score: 17    End of Session Equipment Utilized During Treatment: Gait belt;Rolling walker;Other (comment) (3 in1 over toilet)  OT Visit Diagnosis: Unsteadiness on feet (R26.81);Muscle weakness (generalized) (M62.81);Pain Pain - Right/Left: Left Pain - part of body: Leg   Activity Tolerance Patient tolerated treatment well   Patient Left with call bell/phone within reach;in bed;with family/visitor present   Nurse Communication Mobility status        Time: 1191-4782 OT Time Calculation (min): 26 min  Charges: OT General  Charges $OT Visit: 1 Visit OT Treatments $Self Care/Home Management : 8-22 mins $Therapeutic Activity: 8-22 mins     Galen Manila 11/02/2020, 3:16 PM

## 2020-11-03 LAB — CBC
HCT: 32.4 % — ABNORMAL LOW (ref 39.0–52.0)
Hemoglobin: 11 g/dL — ABNORMAL LOW (ref 13.0–17.0)
MCH: 29.2 pg (ref 26.0–34.0)
MCHC: 34 g/dL (ref 30.0–36.0)
MCV: 85.9 fL (ref 80.0–100.0)
Platelets: 182 10*3/uL (ref 150–400)
RBC: 3.77 MIL/uL — ABNORMAL LOW (ref 4.22–5.81)
RDW: 12.6 % (ref 11.5–15.5)
WBC: 7.5 10*3/uL (ref 4.0–10.5)
nRBC: 0 % (ref 0.0–0.2)

## 2020-11-03 MED ORDER — RIVAROXABAN 10 MG PO TABS
10.0000 mg | ORAL_TABLET | Freq: Every day | ORAL | Status: DC
  Administered 2020-11-03 – 2020-11-04 (×2): 10 mg via ORAL
  Filled 2020-11-03 (×2): qty 1

## 2020-11-03 MED ORDER — TAMSULOSIN HCL 0.4 MG PO CAPS: 0.4000 mg | ORAL_CAPSULE | Freq: Every day | ORAL | Status: DC

## 2020-11-03 MED ORDER — BETHANECHOL CHLORIDE 5 MG PO TABS
5.0000 mg | ORAL_TABLET | Freq: Three times a day (TID) | ORAL | Status: DC
  Administered 2020-11-03 – 2020-11-04 (×4): 5 mg via ORAL
  Filled 2020-11-03 (×4): qty 1

## 2020-11-03 NOTE — Plan of Care (Signed)

## 2020-11-03 NOTE — Progress Notes (Signed)
Physical Therapy Treatment Patient Details Name: Antonio Harrison MRN: [id number] DOB: Aug 08, 1971 Today's Date: 11/03/2020    History of Present Illness Antonio Harrison is an 50 y.o. male. Who fell going to Antonio [facility] concert general admission seating after jumping a guardrail, sustaining a Left tibial plateau fracture with severe comminution and joint subluxation. Pt now s/p LLE ex fix (NWB LLE).    PT Comments    Continuing work on functional mobility and activity tolerance;  Small but notable improvements in bed mobility, transfers, and gait distance as well as activity tolerance; Discussed stair negotiation with Antonio Harrison, pt's wife; noted plan fo rlikely dc home on Friday   Follow Up Recommendations  Home health PT;Supervision - Intermittent     Equipment Recommendations  Rolling walker with 5" wheels;3in1 (PT);Wheelchair (measurements PT);Wheelchair cushion (measurements PT)    Recommendations for Other Services       Precautions / Restrictions Precautions Precautions: Fall Precaution Comments: NWB LLE Required Braces or Orthoses: Other Brace Other Brace: hinged brace locked in extension Restrictions LLE Weight Bearing: Non weight bearing    Mobility  Bed Mobility Overal bed mobility: Needs Assistance Bed Mobility: Supine to Sit     Supine to sit: Min assist     General bed mobility comments: Tried using rolled sheet around foot to allow for pt to self-assist with moving LLE -- slightly better than yesterday, but still needing min assist to move LLE off of the bed; noted struggle with semi-rolling, weight shifting and pushing up on elbows, with inefficient movement, but able to get up with incr time; bed flat to approximate home    Transfers Overall transfer level: Needs assistance Equipment used: Rolling walker (2 wheeled) Transfers: Sit to/from Stand Sit to Stand: Min guard         General transfer comment: Overall good rise; cues for hand placement; moves  tentatively in anticipation of pain  Ambulation/Gait Ambulation/Gait assistance: Min guard (with chair push) Gait Distance (Feet): 40 Feet (into hallwya) Assistive device: Rolling walker (2 wheeled) Gait Pattern/deviations:  (Hop-to pattern)     General Gait Details: verbal and demo cues to hop less and more support self on RW and ease R foot into advancement; good maintenance of NWB;l short, guarded steps   Stairs         General stair comments: Discussed an demonstrated ideas for going up the 2 steps to enter pt's home with pt's wife, Antonio Harrison; will consider going up first step backwards, and then "sitting up" the next step to wheelchair placed at top of step   Wheelchair Mobility    Modified Rankin (Stroke Patients Only)       Balance     Sitting balance-Leahy Scale: Good       Standing balance-Leahy Scale: Poor                              Cognition Arousal/Alertness: Awake/alert Behavior During Therapy: WFL for tasks assessed/performed Overall Cognitive Status: Within Functional Limits for tasks assessed                                        Exercises      General Comments        Pertinent Vitals/Pain Pain Assessment: Faces Faces Pain Scale: Hurts even more Pain Location: LLE Pain Descriptors / Indicators: Aching;Grimacing;Guarding Pain  Intervention(s): Monitored during session;Repositioned    Home Living                      Prior Function            PT Goals (current goals can now be found in the care plan section) Acute Rehab PT Goals Patient Stated Goal: to return home PT Goal Formulation: With patient Time For Goal Achievement: 11/14/20 Potential to Achieve Goals: Good Progress towards PT goals: Progressing toward goals    Frequency    Min 4X/week      PT Plan Current plan remains appropriate    Co-evaluation              AM-PAC PT "6 Clicks" Mobility   Outcome Measure  Help needed  turning from your back to your side while in a flat bed without using bedrails?: A Little Help needed moving from lying on your back to sitting on the side of a flat bed without using bedrails?: A Little Help needed moving to and from a bed to a chair (including a wheelchair)?: A Little Help needed standing up from a chair using your arms (e.g., wheelchair or bedside chair)?: None Help needed to walk in hospital room?: A Little Help needed climbing 3-5 steps with a railing? : A Lot 6 Click Score: 18    End of Session Equipment Utilized During Treatment: Gait belt Activity Tolerance: Patient tolerated treatment well Patient left: in chair;with call bell/phone within reach;Other (comment);with nursing/sitter in room Nurse Communication: Mobility status PT Visit Diagnosis: Unsteadiness on feet (R26.81);Other abnormalities of gait and mobility (R26.89)     Time: 3220-2542 PT Time Calculation (min) (ACUTE ONLY): 36 min  Charges:  $Gait Training: 8-22 mins $Therapeutic Activity: 8-22 mins                     Van Clines, PT  Acute Rehabilitation Services Pager [phone] Office [phone]    Antonio Harrison 11/03/2020, 4:42 PM

## 2020-11-03 NOTE — Progress Notes (Addendum)
ANTICOAGULATION CONSULT NOTE - Initial Consult  Pharmacy Consult for Rivaroxaban Indication: Ortho VTE prophylaxis  No Known Allergies  Patient Measurements: Height: 6\' 4"  (193 cm) Weight: 122.5 kg (270 lb) IBW/kg (Calculated) : 86.8   Vital Signs: Temp: 98.3 F (36.8 C) (03/02 0759) Temp Source: Oral (03/02 0759) BP: 150/99 (03/02 0759) Pulse Rate: 90 (03/02 0759)  Labs: Recent Labs    11/02/20 0130 11/03/20 0423  HGB 11.2* 11.0*  HCT 32.2* 32.4*  PLT 181 182    Estimated Creatinine Clearance: 131.5 mL/min (by C-G formula based on SCr of 0.95 mg/dL).   Medical History: Past Medical History:  Diagnosis Date  . Acute urinary retention post op 10/31/2020   Started flomax.  Patient had catheter placed post op     Assessment: 50 yo M admitted with L Tibial fracture after a fall now s/p ORIF. Pharmacy is consulted for rivaroxaban x 4 weeks for DVT prophylaxis.  Checked insurance copay, which is affordable for patient. Education done with patient. Last received enoxaparin at 10am.    Goal of Therapy:   Monitor platelets by anticoagulation protocol: Yes   Plan:  Stop enoxaparin Start rivaroxaban 10mg  daily x4 weeks Monitor for signs/symptoms of bleeding   42, PharmD, BCPS, BCCP Clinical Pharmacist  Please check AMION for all [facility] Pharmacy phone numbers After 10:00 PM, call Main Pharmacy [phone]

## 2020-11-03 NOTE — Progress Notes (Signed)
Patient was due to void after removal of foley yesterday during day shift.  We tried warm compressions to lower abdomen, running water, and got patient up OOB.  He was unable to void.  Bladder scan was done at 0500.  It showed 671 mL of urine.  I/O cath was done around 0600. Removed 800 mL of urine.

## 2020-11-03 NOTE — Discharge Instructions (Addendum)
Orthopaedic Trauma Service Discharge Instructions   General Discharge Instructions  Orthopaedic Injuries:  Left tibial plateau fracture treated with open reduction and internal fixation using plate and screws   WEIGHT BEARING STATUS: Nonweightbearing Left leg   RANGE OF MOTION/ACTIVITY: no knee motion of left knee at this time. Wear knee brace   Bone health:  Labs show vitamin d deficiency. Take vitamin supplements as prescribed   Wound Care: daily wound care as needed starting when you get home. See below. Use TED hose for swelling control as well  Discharge Wound Care Instructions  Do NOT apply any ointments, solutions or lotions to pin sites or surgical wounds.  These prevent needed drainage and even though solutions like hydrogen peroxide kill bacteria, they also damage cells lining the pin sites that help fight infection.  Applying lotions or ointments can keep the wounds moist and can cause them to breakdown and open up as well. This can increase the risk for infection. When in doubt call the office.  Surgical incisions should be dressed daily.  If any drainage is noted, use one layer of adaptic, then gauze, Kerlix, and an ace wrap. Alternatively you can use mepliex type dressing (this is what you currently have on) or 4x4 gauze and tape   Once the incision is completely dry and without drainage, it may be left open to air out.  Showering may begin 36-48 hours later.  Cleaning gently with soap and water.   DVT/PE prophylaxis: Xarelto daily x 4 weeks   Diet: as you were eating previously.  Can use over the counter stool softeners and bowel preparations, such as Miralax, to help with bowel movements.  Narcotics can be constipating.  Be sure to drink plenty of fluids  PAIN MEDICATION USE AND EXPECTATIONS  You have likely been given narcotic medications to help control your pain.  After a traumatic event that results in an fracture (broken bone) with or without surgery, it is ok  to use narcotic pain medications to help control one's pain.  We understand that everyone responds to pain differently and each individual patient will be evaluated on a regular basis for the continued need for narcotic medications. Ideally, narcotic medication use should last no more than 6-8 weeks (coinciding with fracture healing).   As a patient it is your responsibility as well to monitor narcotic medication use and report the amount and frequency you use these medications when you come to your office visit.   We would also advise that if you are using narcotic medications, you should take a dose prior to therapy to maximize you participation.  IF YOU ARE ON NARCOTIC MEDICATIONS IT IS NOT PERMISSIBLE TO OPERATE A MOTOR VEHICLE (MOTORCYCLE/CAR/TRUCK/MOPED) OR HEAVY MACHINERY DO NOT MIX NARCOTICS WITH OTHER CNS (CENTRAL NERVOUS SYSTEM) DEPRESSANTS SUCH AS ALCOHOL   STOP SMOKING OR USING NICOTINE PRODUCTS!!!!  As discussed nicotine severely impairs your body's ability to heal surgical and traumatic wounds but also impairs bone healing.  Wounds and bone heal by forming microscopic blood vessels (angiogenesis) and nicotine is a vasoconstrictor (essentially, shrinks blood vessels).  Therefore, if vasoconstriction occurs to these microscopic blood vessels they essentially disappear and are unable to deliver necessary nutrients to the healing tissue.  This is one modifiable factor that you can do to dramatically increase your chances of healing your injury.    (This means no smoking, no nicotine gum, patches, etc)  DO NOT USE NONSTEROIDAL ANTI-INFLAMMATORY DRUGS (NSAID'S)  Using products such as Advil (  ibuprofen), Aleve (naproxen), Motrin (ibuprofen) for additional pain control during fracture healing can delay and/or prevent the healing response.  If you would like to take over the counter (OTC) medication, Tylenol (acetaminophen) is ok.  However, some narcotic medications that are given for pain control  contain acetaminophen as well. Therefore, you should not exceed more than 4000 mg of tylenol in a day if you do not have liver disease.  Also note that there are may OTC medicines, such as cold medicines and allergy medicines that my contain tylenol as well.  If you have any questions about medications and/or interactions please ask your doctor/PA or your pharmacist.      ICE AND ELEVATE INJURED/OPERATIVE EXTREMITY  Using ice and elevating the injured extremity above your heart can help with swelling and pain control.  Icing in a pulsatile fashion, such as 20 minutes on and 20 minutes off, can be followed.    Do not place ice directly on skin. Make sure there is a barrier between to skin and the ice pack.    Using frozen items such as frozen peas works well as the conform nicely to the are that needs to be iced.  USE AN ACE WRAP OR TED HOSE FOR SWELLING CONTROL  In addition to icing and elevation, Ace wraps or TED hose are used to help limit and resolve swelling.  It is recommended to use Ace wraps or TED hose until you are informed to stop.    When using Ace Wraps start the wrapping distally (farthest away from the body) and wrap proximally (closer to the body)   Example: If you had surgery on your leg or thing and you do not have a splint on, start the ace wrap at the toes and work your way up to the thigh        If you had surgery on your upper extremity and do not have a splint on, start the ace wrap at your fingers and work your way up to the upper arm  IF YOU ARE IN A SPLINT OR CAST DO NOT REMOVE IT FOR ANY REASON   If your splint gets wet for any reason please contact the office immediately. You may shower in your splint or cast as long as you keep it dry.  This can be done by wrapping in a cast cover or garbage back (or similar)  Do Not stick any thing down your splint or cast such as pencils, money, or hangers to try and scratch yourself with.  If you feel itchy take benadryl as prescribed  on the bottle for itching  IF YOU ARE IN A CAM BOOT (BLACK BOOT)  You may remove boot periodically. Perform daily dressing changes as noted below.  Wash the liner of the boot regularly and wear a sock when wearing the boot. It is recommended that you sleep in the boot until told otherwise    Call office for the following:  Temperature greater than 101F  Persistent nausea and vomiting  Severe uncontrolled pain  Redness, tenderness, or signs of infection (pain, swelling, redness, odor or green/yellow discharge around the site)  Difficulty breathing, headache or visual disturbances  Hives  Persistent dizziness or light-headedness  Extreme fatigue  Any other questions or concerns you may have after discharge  In an emergency, call 911 or go to an Emergency Department at a nearby hospital  HELPFUL INFORMATION  ? If you had a block, it will wear off between 8-24 hrs postop  typically.  This is period when your pain may go from nearly zero to the pain you would have had postop without the block.  This is an abrupt transition but nothing dangerous is happening.  You may take an extra dose of narcotic when this happens.  ? You should wean off your narcotic medicines as soon as you are able.  Most patients will be off or using minimal narcotics before their first postop appointment.   ? We suggest you use the pain medication the first night prior to going to bed, in order to ease any pain when the anesthesia wears off. You should avoid taking pain medications on an empty stomach as it will make you nauseous.  ? Do not drink alcoholic beverages or take illicit drugs when taking pain medications.  ? In most states it is against the law to drive while you are in a splint or sling.  And certainly against the law to drive while taking narcotics.  ? You may return to work/school in the next couple of days when you feel up to it.   ? Pain medication may make you constipated.  Below are a few  solutions to try in this order: - Decrease the amount of pain medication if you arent having pain. - Drink lots of decaffeinated fluids. - Drink prune juice and/or each dried prunes  o If the first 3 dont work start with additional solutions - Take Colace - an over-the-counter stool softener - Take Senokot - an over-the-counter laxative - Take Miralax - a stronger over-the-counter laxative     CALL THE OFFICE WITH ANY QUESTIONS OR CONCERNS: [phone]   VISIT OUR WEBSITE FOR ADDITIONAL INFORMATION: [email or web address]     Information on my medicine - XARELTO (Rivaroxaban)  This medication education was reviewed with me or my healthcare representative as part of my discharge preparation.     Why was Xarelto prescribed for you? Xarelto was prescribed for you to reduce the risk of blood clots forming after orthopedic surgery. The medical term for these abnormal blood clots is venous thromboembolism (VTE).  What do you need to know about xarelto ? Take your Xarelto ONCE DAILY at the same time every day. You may take it either with or without food; with food is preferred   If you have difficulty swallowing the tablet whole, you may crush it and mix in applesauce just prior to taking your dose.  Take Xarelto exactly as prescribed by your doctor and DO NOT stop taking Xarelto without talking to the doctor who prescribed the medication.  Stopping without other VTE prevention medication to take the place of Xarelto may increase your risk of developing a clot.  After discharge, you should have regular check-up appointments with your healthcare provider that is prescribing your Xarelto.    What do you do if you miss a dose? If you miss a dose, take it as soon as you remember on the same day then continue your regularly scheduled once daily regimen the next day. Do not take two doses of Xarelto on the same day.   Important Safety Information A possible side effect of Xarelto  is bleeding. You should call your healthcare provider right away if you experience any of the following: ? Bleeding from an injury or your nose that does not stop. ? Unusual colored urine (red or dark brown) or unusual colored stools (red or black). ? Unusual bruising for unknown reasons. ? A serious fall or if you hit your  head (even if there is no bleeding).  Some medicines may interact with Xarelto and might increase your risk of bleeding while on Xarelto. To help avoid this, consult your healthcare provider or pharmacist prior to using any new prescription or non-prescription medications, including herbals, vitamins, non-steroidal anti-inflammatory drugs (NSAIDs) and supplements.  This website has more information on Xarelto: [email or web address]

## 2020-11-03 NOTE — Progress Notes (Signed)
Orthopaedic Trauma Service Progress Note  Patient ID: Antonio Harrison MRN: [id number] DOB/AGE: 1971/04/26 50 y.o.  Subjective:  Doing fair Rough night pain wise Some issues with urinary retention. Bladder scan yesterday showed 600 cc, I&O performed and yielded 800  No other significant complaints this morning   ROS No chest pain or shortness of breath  no abdominal pain  Objective:   VITALS:   Vitals:   11/02/20 1404 11/02/20 2100 11/03/20 0428 11/03/20 0759  BP: (!) 145/81 (!) 166/84 (!) 168/97 (!) 150/99  Pulse: 100  (!) 107 90  Resp: 18 18 18 17   Temp: 98 F (36.7 C) 99 F (37.2 C) 98.3 F (36.8 C) 98.3 F (36.8 C)  TempSrc:   Oral Oral  SpO2: 94% 90% 90% 91%  Weight:      Height:        Estimated body mass index is 32.87 kg/m as calculated from the following:   Height as of this encounter: 6\' 4"  (1.93 m).   Weight as of this encounter: 122.5 kg.   Intake/Output      03/01 0701 03/02 0700 03/02 0701 03/03 0700   P.O. 240    I.V. (mL/kg)     IV Piggyback     Total Intake(mL/kg) 240 (2)    Urine (mL/kg/hr) 2200 (0.7)    Blood     Total Output 2200    Net -1960           LABS  Results for orders placed or performed during the hospital encounter of 10/30/20 (from the past 24 hour(s))  CBC     Status: Abnormal   Collection Time: 11/03/20  4:23 AM  Result Value Ref Range   WBC 7.5 4.0 - 10.5 K/uL   RBC 3.77 (L) 4.22 - 5.81 MIL/uL   Hemoglobin 11.0 (L) 13.0 - 17.0 g/dL   HCT 11/01/20 (L) 01/03/21 - 81.2 %   MCV 85.9 80.0 - 100.0 fL   MCH 29.2 26.0 - 34.0 pg   MCHC 34.0 30.0 - 36.0 g/dL   RDW 75.1 70.0 - 17.4 %   Platelets 182 150 - 400 K/uL   nRBC 0.0 0.0 - 0.2 %     PHYSICAL EXAM:   Gen: appears well, no acute distress Lungs: unlabored Cardiac: RRR Abd: + BS, NTND Ext:       Left Lower Extremity              Dressing clean, dry and intact  Hinged knee braces on and  fitting well.  It is locked in full extension             Ext warm              Minimal swelling             No DCT             Compartments soft, no pain out of proportion with passive stretch              Motor and sensory functions intact                Assessment/Plan: 2 Days Post-Op   Principal Problem:   Closed bicondylar fracture of left tibial plateau Active Problems:   Status post surgery for ex fix placement  on left leg   Acute urinary retention post op   Anti-infectives (From admission, onward)   Start     Dose/Rate Route Frequency Ordered Stop   11/02/20 0200  ceFAZolin (ANCEF) IVPB 2g/100 mL premix        2 g 200 mL/hr over 30 Minutes Intravenous Every 8 hours 11/01/20 2051 11/02/20 1919   11/01/20 1644  vancomycin (VANCOCIN) powder  Status:  Discontinued          As needed 11/01/20 1645 11/01/20 1855   11/01/20 1015  ceFAZolin (ANCEF) 3 g in dextrose 5 % 50 mL IVPB        3 g 100 mL/hr over 30 Minutes Intravenous On call to O.R. 11/01/20 0916 11/02/20 0559   10/31/20 0400  ceFAZolin (ANCEF) IVPB 2g/100 mL premix        2 g 200 mL/hr over 30 Minutes Intravenous Every 6 hours 10/31/20 0157 10/31/20 1753   10/30/20 2115  ceFAZolin (ANCEF) IVPB 2g/100 mL premix        2 g 200 mL/hr over 30 Minutes Intravenous On call to O.R. 10/30/20 2056 10/30/20 2232    .  POD/HD#: 2  50 y/o male s/p fall with complex L bicondylar tibial plateau fracture   -comminuted L bicondylar tibial plateau fracture with severe injury to lateral articular surface s/p ORIF and lateral menisectomy              NWB L leg x 8 weeks             Hinged brace but locked in full extension x 2 weeks then begin unrestricted ROM              Ice and elevate             Dressing change tomorrow             PT/OT                          Given the amount of injury present pt will need TKA in the near future    - Pain management:             Multimodal   Focus on p.o.'s today   IVs only if  absolutely necessary   - ABL anemia/Hemodynamics             Stable    - Medical issues              Vitamin d deficinecy                          Supplement    Acute urinary retention   Flomax and bethanechol started today   Monitor   Mobilize              - DVT/PE prophylaxis:             Xarelto x 4 weeks   - ID:              periop abx   - Metabolic Bone Disease:             As above   - Activity:             Up with therapies    - FEN/GI prophylaxis/Foley/Lines:             Reg diet  Monitor urine output             NSL IVF    - Impediments to fracture healing:             Severe soft tissue envelope injury   - Dispo:             Therapies             Likely home on Friday             Arrange HH needs/DME needs   [name], PA-C [phone] (C) 11/03/2020, 9:20 AM  [facility] [address] [place] [address] [phone] [name] (F)    After 5pm and on the weekends please log on to Amion, go to orthopaedics and the look under the [facility] Call for the provider(s) on call. You can also call our office at [phone] and then follow the prompts to be connected to the call team.

## 2020-11-04 ENCOUNTER — Encounter (HOSPITAL_COMMUNITY): Payer: Self-pay | Admitting: Orthopedic Surgery

## 2020-11-04 NOTE — Plan of Care (Signed)
Patient is s/p external fixator and ORIF by Dr. Carola Frost. ACE wrap, hinged brace and ice to left lower extremity. Elevated with pillows, raised blankets, and bone foam. Gave patient two hydrocodones x1 this shift as ordered PRN for pain. Patient is voiding WNL - postop urinary retention has resolved. Will continue to monitor and continue current POC.

## 2020-11-04 NOTE — Progress Notes (Signed)
Physical Therapy Treatment Patient Details Name: Antonio Harrison MRN: [id number] DOB: June 17, 1971 Today's Date: 11/04/2020    History of Present Illness Antonio Harrison is an 50 y.o. male. Who fell going to [facility] concert general admission seating after jumping a guardrail, sustaining a Left tibial plateau fracture with severe comminution and joint subluxation. Pt now s/p LLE ex fix (NWB LLE).    PT Comments    Focused session on stair and bed mobility training as these appear to be his major deficits that impact his independence. Pt benefits from use of sheet wrapped around L leg to assist with transition supine > sit, but he still requires extensive time and effort along with minA to complete. Thus, pt may benefit from hospital bed initially upon d/c to maximize his independence with mobility in case of emergency if pt's wife were not at home to assist. Practiced ascending 1 stair backwards with a hop and with pt pushing on RW anterior to him for support with minA for balance. Min guard to ascend 1 stair forward with pt again using RW on ground level to support his weight. Educated pt and his wife on this technique with w/c being on 2nd stair for pt to sit in once on 1st stair or rise up from to get to 1st stair when descending. They report their church may assist with a ramp and educated them that this would improve pt independence with mobility, especially in case of need to exit house during emergency. Will continue to follow acutely. Current recommendations remain appropriate.   Follow Up Recommendations  Home health PT;Supervision - Intermittent     Equipment Recommendations  Rolling walker with 5" wheels;3in1 (PT);Wheelchair (measurements PT);Wheelchair cushion (measurements PT);Hospital bed (church may provide ramp)    Recommendations for Other Services       Precautions / Restrictions Precautions Precautions: Fall Precaution Comments: NWB LLE Required Braces or Orthoses:  Other Brace Other Brace: hinged brace locked in extension Restrictions Weight Bearing Restrictions: Yes LLE Weight Bearing: Non weight bearing Other Position/Activity Restrictions: LLE NWB with ex fix    Mobility  Bed Mobility Overal bed mobility: Needs Assistance Bed Mobility: Supine to Sit     Supine to sit: Min assist     General bed mobility comments: Bed flat and no use of rails. Attempted use of sheet slid by PT under L thigh and either end of sheet in each hand of pt. Pt able to pull self up to long-sitting, remove sheet from under thigh, and then toss sheet to wrap around foot to assist with transition of leg to R EOB to simulate home. Pt required extensive period of time and minA to manage L leg safely off EOB.    Transfers Overall transfer level: Needs assistance Equipment used: Rolling walker (2 wheeled) Transfers: Sit to/from [employer] Sit to Stand: Min guard Stand pivot transfers: Min guard       General transfer comment: Pt alternating 1 hand on current sitting surface and 1 on RW, 1x sit to stand from bed and 1x from recliner. Min guard and extra time to power up to rise, no LOB. Min guard for safety with hop to R to transfer bed > chair with RW.  Ambulation/Gait Ambulation/Gait assistance: Min guard (with chair push) Gait Distance (Feet): 3 Feet Assistive device: Rolling walker (2 wheeled) Gait Pattern/deviations:  (Hop-to pattern) Gait velocity: reduced Gait velocity interpretation: <1.31 ft/sec, indicative of household ambulator General Gait Details: Good maintenance of NWB  on L leg, relying on RW for bil UE support for hop to R to chair from bed. No LOB, min guard for safety.   Stairs Stairs: Yes Stairs assistance: Min guard;Min assist Stair Management: No rails;Backwards;Step to pattern;With walker Number of Stairs: 1 General stair comments: Pt turned and backed up to bottom stair, ascending posteriorly with hop and with bil UEs  providing support on RW placed anterior to him, minA for balance. Hopped back down forwards with bil UEs on RW at ground level, min guard. Educated pt to have w/c at top (2nd) step to sit in once on 1st step to ascend or to rise up from to start descent on final step. Discussed benefits of ramp also for pt independence with mobility.   Wheelchair Mobility    Modified Rankin (Stroke Patients Only)       Balance Overall balance assessment: Needs assistance Sitting-balance support: No upper extremity supported;Feet supported Sitting balance-Leahy Scale: Good     Standing balance support: Bilateral upper extremity supported;During functional activity Standing balance-Leahy Scale: Poor Standing balance comment: Bil UE support and min guard                            Cognition Arousal/Alertness: Awake/alert Behavior During Therapy: WFL for tasks assessed/performed Overall Cognitive Status: Within Functional Limits for tasks assessed                                        Exercises      General Comments General comments (skin integrity, edema, etc.): Applied ice to leg end of session      Pertinent Vitals/Pain Pain Assessment: Faces Faces Pain Scale: Hurts whole lot Pain Location: LLE Pain Descriptors / Indicators: Aching;Grimacing;Guarding Pain Intervention(s): Limited activity within patient's tolerance;Monitored during session;Repositioned    Home Living                      Prior Function            PT Goals (current goals can now be found in the care plan section) Acute Rehab PT Goals Patient Stated Goal: to return home PT Goal Formulation: With patient Time For Goal Achievement: 11/14/20 Potential to Achieve Goals: Good Progress towards PT goals: Progressing toward goals    Frequency    Min 4X/week      PT Plan Current plan remains appropriate;Equipment recommendations need to be updated    Co-evaluation               AM-PAC PT "6 Clicks" Mobility   Outcome Measure  Help needed turning from your back to your side while in a flat bed without using bedrails?: A Little Help needed moving from lying on your back to sitting on the side of a flat bed without using bedrails?: A Little Help needed moving to and from a bed to a chair (including a wheelchair)?: A Little Help needed standing up from a chair using your arms (e.g., wheelchair or bedside chair)?: None Help needed to walk in hospital room?: A Little Help needed climbing 3-5 steps with a railing? : A Lot 6 Click Score: 18    End of Session Equipment Utilized During Treatment: Gait belt;Other (comment) (L leg extension brace) Activity Tolerance: Patient tolerated treatment well Patient left: in chair;with call bell/phone within reach;with family/visitor present   PT Visit  Diagnosis: Unsteadiness on feet (R26.81);Other abnormalities of gait and mobility (R26.89);Muscle weakness (generalized) (M62.81);Difficulty in walking, not elsewhere classified (R26.2)     Time: 1610-9604 PT Time Calculation (min) (ACUTE ONLY): 34 min  Charges:  $Gait Training: 8-22 mins $Therapeutic Activity: 8-22 mins                     Raymond Gurney, PT, DPT Acute Rehabilitation Services  Pager: [phone] Office: [phone]    Jewel Baize 11/04/2020, 11:26 AM

## 2020-11-04 NOTE — Plan of Care (Signed)
No acute changes since the previous night that I took care of him. Plan for discharge tomorrow. Will continue to monitor and continue current POC.

## 2020-11-04 NOTE — Progress Notes (Signed)
Occupational Therapy Treatment Patient Details Name: Antonio Harrison MRN: [id number] DOB: Jan 10, 1971 Today's Date: 11/04/2020    History of present illness Antonio Harrison is an 50 y.o. male. Who fell going to [facility] concert general admission seating after jumping a guardrail, sustaining a Left tibial plateau fracture with severe comminution and joint subluxation. Pt now s/p LLE ex fix (NWB LLE).   OT comments  Pt making good progress with functional goals. Pt's wife present this session. OT session focused on pt/family trg for ADL/selfcare and ADL mobility safety for toilet transfers, toileting, LB ADLs. OT will continue to follow acutely to maximize level of function and safety  Follow Up Recommendations  Home health OT;Supervision/Assistance - 24 hour;Other (comment)    Equipment Recommendations  3 in 1 bedside commode;Other (comment) (RW, reacher)    Recommendations for Other Services      Precautions / Restrictions Precautions Precautions: Fall Precaution Comments: NWB LLE Other Brace: hinged brace locked in extension Restrictions Weight Bearing Restrictions: Yes LLE Weight Bearing: Non weight bearing       Mobility Bed Mobility Overal bed mobility: Needs Assistance Bed Mobility: Sit to Supine       Sit to supine: Mod assist   General bed mobility comments: pt in recliner upon arrival, mod A with LEs back onto bed    Transfers Overall transfer level: Needs assistance Equipment used: Rolling walker (2 wheeled) Transfers: Sit to/from [employer] Sit to Stand: Min guard Stand pivot transfers: Min guard            Balance Overall balance assessment: Needs assistance Sitting-balance support: No upper extremity supported;Feet supported Sitting balance-Leahy Scale: Good     Standing balance support: Bilateral upper extremity supported;During functional activity Standing balance-Leahy Scale: Poor                              ADL either performed or assessed with clinical judgement   ADL Overall ADL's : Needs assistance/impaired                                             Vision Baseline Vision/History: No visual deficits Patient Visual Report: No change from baseline     Perception     Praxis      Cognition Arousal/Alertness: Awake/alert Behavior During Therapy: WFL for tasks assessed/performed Overall Cognitive Status: Within Functional Limits for tasks assessed                                          Exercises     Shoulder Instructions       General Comments      Pertinent Vitals/ Pain       Pain Assessment: Faces Faces Pain Scale: Hurts little more Pain Location: LLE Pain Descriptors / Indicators: Aching;Grimacing;Guarding Pain Intervention(s): Limited activity within patient's tolerance;Premedicated before session;Monitored during session;Repositioned  Home Living                                          Prior Functioning/Environment              Frequency  Min  2X/week        Progress Toward Goals  OT Goals(current goals can now be found in the care plan section)  Progress towards OT goals: Progressing toward goals  Acute Rehab OT Goals Patient Stated Goal: to return home  Plan Discharge plan remains appropriate;Frequency remains appropriate    Co-evaluation                 AM-PAC OT "6 Clicks" Daily Activity     Outcome Measure   Help from another person eating meals?: None Help from another person taking care of personal grooming?: A Little Help from another person toileting, which includes using toliet, bedpan, or urinal?: A Little Help from another person bathing (including washing, rinsing, drying)?: A Little Help from another person to put on and taking off regular upper body clothing?: None Help from another person to put on and taking off regular lower body clothing?: A Lot 6 Click  Score: 19    End of Session Equipment Utilized During Treatment: Gait belt;Rolling walker;Other (comment)  OT Visit Diagnosis: Unsteadiness on feet (R26.81);Muscle weakness (generalized) (M62.81);Pain Pain - Right/Left: Left Pain - part of body: Leg   Activity Tolerance Patient tolerated treatment well   Patient Left with call bell/phone within reach;in bed;with family/visitor present   Nurse Communication Mobility status        Time: 1414-1450 OT Time Calculation (min): 36 min  Charges: OT General Charges $OT Visit: 1 Visit OT Treatments $Self Care/Home Management : 8-22 mins $Therapeutic Activity: 8-22 mins    Galen Manila 11/04/2020, 3:46 PM

## 2020-11-04 NOTE — Progress Notes (Signed)
Orthopedic Tech Progress Note Patient Details:  Antonio Harrison 09-19-1970 [id number]  Patient ID: [name], male   DOB: 1971-01-14, 50 y.o.   MRN: [id number] Called hanger to check on hinged knee brace.  Trinna Post 11/04/2020, 5:57 AM

## 2020-11-04 NOTE — Progress Notes (Signed)
Orthopaedic Trauma Service Progress Note  Patient ID: Antonio Harrison MRN: [id number] DOB/AGE: 06-May-1971 50 y.o.  Subjective:  Resting comfortably in bed, no acute distress, appears well Pain tolerable He has been able to void  No other complaints  ROS As above  Objective:   VITALS:   Vitals:   11/03/20 1421 11/03/20 2053 11/04/20 0556 11/04/20 0754  BP: (!) 155/90 136/87 (!) 141/91 (!) 143/95  Pulse: 90 96 91 91  Resp: 18 20 20 18   Temp: 98.6 F (37 C) 98.4 F (36.9 C) 98.4 F (36.9 C) 98.1 F (36.7 C)  TempSrc: Oral Oral Oral Oral  SpO2: 96% 97% 94% 95%  Weight:      Height:        Estimated body mass index is 32.87 kg/m as calculated from the following:   Height as of this encounter: 6\' 4"  (1.93 m).   Weight as of this encounter: 122.5 kg.   Intake/Output      03/02 0701 03/03 0700 03/03 0701 03/04 0700   P.O. 480    I.V. (mL/kg) 635.8 (5.2)    Total Intake(mL/kg) 1115.8 (9.1)    Urine (mL/kg/hr) 1420 (0.5)    Stool 0    Total Output 1420    Net -304.3         Urine Occurrence 1 x    Stool Occurrence 1 x      LABS  No results found for this or any previous visit (from the past 24 hour(s)).   PHYSICAL EXAM:    Gen: appears well, no acute distress, sitting up in bed Lungs: unlabored Cardiac: RRR Abd: + BS, NTND Ext:       Left Lower Extremity              Dressing clean, dry and intact             Hinged knee braces on and fitting well.  It is locked in full extension  Dressings removed   All incisions are healing nicely.  Some minimal serosanguineous drainage from his lateral wound   No erythema             Ext warm              Minimal swelling             No DCT             Compartments soft, no pain out of proportion with passive stretch              Motor and sensory functions intact                  Assessment/Plan: 3 Days Post-Op   Principal  Problem:   Closed bicondylar fracture of left tibial plateau Active Problems:   Status post surgery for ex fix placement on left leg   Acute urinary retention post op   Anti-infectives (From admission, onward)   Start     Dose/Rate Route Frequency Ordered Stop   11/02/20 0200  ceFAZolin (ANCEF) IVPB 2g/100 mL premix        2 g 200 mL/hr over 30 Minutes Intravenous Every 8 hours 11/01/20 2051 11/02/20 1919   11/01/20 1644  vancomycin (VANCOCIN) powder  Status:  Discontinued  As needed 11/01/20 1645 11/01/20 1855   11/01/20 1015  ceFAZolin (ANCEF) 3 g in dextrose 5 % 50 mL IVPB        3 g 100 mL/hr over 30 Minutes Intravenous On call to O.R. 11/01/20 0916 11/02/20 0559   10/31/20 0400  ceFAZolin (ANCEF) IVPB 2g/100 mL premix        2 g 200 mL/hr over 30 Minutes Intravenous Every 6 hours 10/31/20 0157 10/31/20 1753   10/30/20 2115  ceFAZolin (ANCEF) IVPB 2g/100 mL premix        2 g 200 mL/hr over 30 Minutes Intravenous On call to O.R. 10/30/20 2056 10/30/20 2232    .  POD/HD#: 2  50 y/o male s/p fall with complex L bicondylar tibial plateau fracture   -comminuted L bicondylar tibial plateau fracture with severe injury to lateral articular surface s/p ORIF and lateral menisectomy              NWB L leg x 8 weeks             Hinged brace but locked in full extension x 2 weeks then begin unrestricted ROM              Ice and elevate             Dressing changed   New Mepilex applied   Thigh-high TED hose   Change daily as needed   Okay to shower and clean with soap and water only             PT/OT                          Given the amount of injury present pt will need TKA in the near future    - Pain management:             Multimodal                         Focus on p.o.'s                          IVs only if absolutely necessary   - ABL anemia/Hemodynamics             Stable    - Medical issues              Vitamin d deficinecy                           Supplement                Acute urinary retention                         Continue Flomax, DC bethanechol                         Monitor                         Mobilize              - DVT/PE prophylaxis:             Xarelto x 4 weeks   - ID:              periop abx   -  Metabolic Bone Disease:             As above   - Activity:             Up with therapies    - FEN/GI prophylaxis/Foley/Lines:             Reg diet             Monitor urine output             NSL IVF    - Impediments to fracture healing:             Severe soft tissue envelope injury   - Dispo:             Therapies             Likely home tomorrow             Arrange HH needs/DME needs      Antonio Latin, PA-C [phone] (C) 11/04/2020, 9:34 AM  [facility] [address] [place] [address] [phone] Antonio Harrison (F)    After 5pm and on the weekends please log on to Amion, go to orthopaedics and the look under the [facility] Call for the provider(s) on call. You can also call our office at [phone] and then follow the prompts to be connected to the call team.

## 2020-11-05 ENCOUNTER — Encounter (HOSPITAL_COMMUNITY): Payer: Self-pay | Admitting: Orthopedic Surgery

## 2020-11-05 DIAGNOSIS — I10 Essential (primary) hypertension: Secondary | ICD-10-CM

## 2020-11-05 DIAGNOSIS — E559 Vitamin D deficiency, unspecified: Secondary | ICD-10-CM

## 2020-11-05 HISTORY — DX: Vitamin D deficiency, unspecified: E55.9

## 2020-11-05 MED ORDER — MAGNESIUM CITRATE PO SOLN: 1.0000 | Freq: Once | ORAL | 0 refills | Status: AC | PRN

## 2020-11-05 MED ORDER — METHOCARBAMOL 500 MG PO TABS: 500.0000 mg | ORAL_TABLET | Freq: Three times a day (TID) | ORAL | 0 refills | Status: AC

## 2020-11-05 MED ORDER — VITAMIN D 125 MCG (5000 UT) PO CAPS: 1.0000 | ORAL_CAPSULE | Freq: Every day | ORAL | 6 refills | Status: AC

## 2020-11-05 MED ORDER — DOCUSATE SODIUM 100 MG PO CAPS: 100.0000 mg | ORAL_CAPSULE | Freq: Two times a day (BID) | ORAL | 0 refills | Status: AC

## 2020-11-05 MED ORDER — TAMSULOSIN HCL 0.4 MG PO CAPS: 0.4000 mg | ORAL_CAPSULE | Freq: Every day | ORAL | 0 refills | Status: AC

## 2020-11-05 MED ORDER — RIVAROXABAN 10 MG PO TABS: 10.0000 mg | ORAL_TABLET | Freq: Every day | ORAL | 0 refills | Status: AC

## 2020-11-05 MED ORDER — HYDROCHLOROTHIAZIDE 12.5 MG PO CAPS: 12.5000 mg | ORAL_CAPSULE | Freq: Every day | ORAL | 0 refills | Status: AC

## 2020-11-05 MED ORDER — ASCORBIC ACID 1000 MG PO TABS: 1000.0000 mg | ORAL_TABLET | Freq: Every day | ORAL | 1 refills | Status: AC

## 2020-11-05 MED ORDER — ACETAMINOPHEN 325 MG PO TABS
650.0000 mg | ORAL_TABLET | Freq: Two times a day (BID) | ORAL | 0 refills | Status: AC
Start: 2020-11-05 — End: ?

## 2020-11-05 MED ORDER — HYDROCODONE-ACETAMINOPHEN 5-325 MG PO TABS: 1.0000 | ORAL_TABLET | Freq: Four times a day (QID) | ORAL | 0 refills | Status: AC | PRN

## 2020-11-05 MED ORDER — HYDRALAZINE HCL 10 MG PO TABS
10.0000 mg | ORAL_TABLET | Freq: Once | ORAL | Status: AC
  Administered 2020-11-05: 10 mg via ORAL
  Filled 2020-11-05: qty 1

## 2020-11-05 MED ORDER — HYDROCHLOROTHIAZIDE 12.5 MG PO CAPS: 12.5000 mg | ORAL_CAPSULE | Freq: Every day | ORAL | Status: DC

## 2020-11-05 NOTE — Progress Notes (Signed)
Patient discharged to home with rolling front wheel walker and 3:1 commode as ordered.  Patient received AVS discharge paperwork and instructions with no questions or concerns verbalized at this time. PIV removed prior to discharge and transportation provided by family home.

## 2020-11-05 NOTE — TOC Transition Note (Incomplete)
Transition of Care [facility]) - CM/SW Discharge Note   Patient Details  Name: Antonio Harrison MRN: [id number] Date of Birth: Feb 16, 1971  [facility] CM/SW Contact:  Epifanio Lesches, RN Phone Number: 11/05/2020, 10:21 AM   Clinical Narrative:    Patient will DC to: home Anticipated DC date: 11/05/2020 Family notified: yes Transport by:            -  s/p LLE ex fix (NWB LLE) Per MD patient ready for DC today . RN, patient, patient's family, and facility notified of DC. Discharge Summary and FL2 sent to facility. RN to call report prior to discharge (). DC packet on chart. Ambulance transport requested for patient.   RNCM will sign off for now as intervention is no longer needed. Please consult [place] again if new needs arise.    Final next level of care: Home w Home Health Services Barriers to Discharge: No Barriers Identified   Patient Goals and CMS Choice Patient states their goals for this hospitalization and ongoing recovery are:: to get better and go home   Choice offered to / list presented to : Patient  Discharge Placement                       Discharge Plan and Services   Discharge Planning Services: CM Consult                      HH Arranged: PT [facility] Agency: [facility] Date [facility] Agency Contacted: 11/02/20 Time HH Agency Contacted: [phone] Representative spoke with at [facility] Agency: Kandee Antonio  Social Determinants of Health (SDOH) Interventions     Readmission Risk Interventions No flowsheet data found.

## 2020-11-05 NOTE — Discharge Summary (Incomplete)
Orthopaedic Trauma Service (OTS) Discharge Summary   Patient ID: Antonio Harrison MRN: [id number] DOB/AGE: May 08, 1971 50 y.o.  Admit date: 10/30/2020 Discharge date: 11/05/2020  Admission Diagnoses:***  Discharge Diagnoses:  Principal Problem:   Closed bicondylar fracture of left tibial plateau Active Problems:   Vitamin D deficiency   HTN (hypertension)   Past Medical History:  Diagnosis Date  . Acute urinary retention post op 10/31/2020   Started flomax.  Patient had catheter placed post op  . Vitamin D deficiency 11/05/2020     Procedures Performed:  Discharged Condition: {condition:18240}  Hospital Course: ***  Consults: {consultation:18241}  Significant Diagnostic Studies: {diagnostics:18242}  Treatments: {Tx:18249}  Discharge Exam:  ***  Disposition: Discharge disposition: 01-Home or Self Care       Discharge Instructions    Call MD / Call 911   Complete by: As directed    If you experience chest pain or shortness of breath, CALL 911 and be transported to the hospital emergency room.  If you develope a fever above 101 F, pus (white drainage) or increased drainage or redness at the wound, or calf pain, call your surgeon's office.   Constipation Prevention   Complete by: As directed    Drink plenty of fluids.  Prune juice may be helpful.  You may use a stool softener, such as Colace (over the counter) 100 mg twice a day.  Use MiraLax (over the counter) for constipation as needed.   Diet general   Complete by: As directed    Discharge instructions   Complete by: As directed    Orthopaedic Trauma Service Discharge Instructions   General Discharge Instructions  Orthopaedic Injuries:  Left tibial plateau fracture treated with open reduction and internal fixation using plate and screws   WEIGHT BEARING STATUS: Nonweightbearing Left leg   RANGE OF MOTION/ACTIVITY: no knee motion of left knee at this time. Wear knee brace   Bone health:   Labs show vitamin d deficiency. Take vitamin supplements as prescribed   Wound Care: daily wound care as needed starting when you get home. See below. Use TED hose for swelling control as well  Discharge Wound Care Instructions  Do NOT apply any ointments, solutions or lotions to pin sites or surgical wounds.  These prevent needed drainage and even though solutions like hydrogen peroxide kill bacteria, they also damage cells lining the pin sites that help fight infection.  Applying lotions or ointments can keep the wounds moist and can cause them to breakdown and open up as well. This can increase the risk for infection. When in doubt call the office.  Surgical incisions should be dressed daily.  If any drainage is noted, use one layer of adaptic, then gauze, Kerlix, and an ace wrap. Alternatively you can use mepliex type dressing (this is what you currently have on) or 4x4 gauze and tape   Once the incision is completely dry and without drainage, it may be left open to air out.  Showering may begin 36-48 hours later.  Cleaning gently with soap and water.   DVT/PE prophylaxis: Xarelto daily x 4 weeks   Diet: as you were eating previously.  Can use over the counter stool softeners and bowel preparations, such as Miralax, to help with bowel movements.  Narcotics can be constipating.  Be sure to drink plenty of fluids  PAIN MEDICATION USE AND EXPECTATIONS  You have likely been given narcotic medications to help control your pain.  After a traumatic event that results  in an fracture (broken bone) with or without surgery, it is ok to use narcotic pain medications to help control one's pain.  We understand that everyone responds to pain differently and each individual patient will be evaluated on a regular basis for the continued need for narcotic medications. Ideally, narcotic medication use should last no more than 6-8 weeks (coinciding with fracture healing).   As a patient it is your responsibility  as well to monitor narcotic medication use and report the amount and frequency you use these medications when you come to your office visit.   We would also advise that if you are using narcotic medications, you should take a dose prior to therapy to maximize you participation.  IF YOU ARE ON NARCOTIC MEDICATIONS IT IS NOT PERMISSIBLE TO OPERATE A MOTOR VEHICLE (MOTORCYCLE/CAR/TRUCK/MOPED) OR HEAVY MACHINERY DO NOT MIX NARCOTICS WITH OTHER CNS (CENTRAL NERVOUS SYSTEM) DEPRESSANTS SUCH AS ALCOHOL   STOP SMOKING OR USING NICOTINE PRODUCTS!!!!  As discussed nicotine severely impairs your body's ability to heal surgical and traumatic wounds but also impairs bone healing.  Wounds and bone heal by forming microscopic blood vessels (angiogenesis) and nicotine is a vasoconstrictor (essentially, shrinks blood vessels).  Therefore, if vasoconstriction occurs to these microscopic blood vessels they essentially disappear and are unable to deliver necessary nutrients to the healing tissue.  This is one modifiable factor that you can do to dramatically increase your chances of healing your injury.    (This means no smoking, no nicotine gum, patches, etc)  DO NOT USE NONSTEROIDAL ANTI-INFLAMMATORY DRUGS (NSAID'S)  Using products such as Advil (ibuprofen), Aleve (naproxen), Motrin (ibuprofen) for additional pain control during fracture healing can delay and/or prevent the healing response.  If you would like to take over the counter (OTC) medication, Tylenol (acetaminophen) is ok.  However, some narcotic medications that are given for pain control contain acetaminophen as well. Therefore, you should not exceed more than 4000 mg of tylenol in a day if you do not have liver disease.  Also note that there are may OTC medicines, such as cold medicines and allergy medicines that my contain tylenol as well.  If you have any questions about medications and/or interactions please ask your doctor/PA or your pharmacist.       ICE AND ELEVATE INJURED/OPERATIVE EXTREMITY  Using ice and elevating the injured extremity above your heart can help with swelling and pain control.  Icing in a pulsatile fashion, such as 20 minutes on and 20 minutes off, can be followed.    Do not place ice directly on skin. Make sure there is a barrier between to skin and the ice pack.    Using frozen items such as frozen peas works well as the conform nicely to the are that needs to be iced.  USE AN ACE WRAP OR TED HOSE FOR SWELLING CONTROL  In addition to icing and elevation, Ace wraps or TED hose are used to help limit and resolve swelling.  It is recommended to use Ace wraps or TED hose until you are informed to stop.    When using Ace Wraps start the wrapping distally (farthest away from the body) and wrap proximally (closer to the body)   Example: If you had surgery on your leg or thing and you do not have a splint on, start the ace wrap at the toes and work your way up to the thigh        If you had surgery on your upper extremity and  do not have a splint on, start the ace wrap at your fingers and work your way up to the upper arm  IF YOU ARE IN A SPLINT OR CAST DO NOT REMOVE IT FOR ANY REASON   If your splint gets wet for any reason please contact the office immediately. You may shower in your splint or cast as long as you keep it dry.  This can be done by wrapping in a cast cover or garbage back (or similar)  Do Not stick any thing down your splint or cast such as pencils, money, or hangers to try and scratch yourself with.  If you feel itchy take benadryl as prescribed on the bottle for itching  IF YOU ARE IN A CAM BOOT (BLACK BOOT)  You may remove boot periodically. Perform daily dressing changes as noted below.  Wash the liner of the boot regularly and wear a sock when wearing the boot. It is recommended that you sleep in the boot until told otherwise    Call office for the following: Temperature greater than 101F Persistent  nausea and vomiting Severe uncontrolled pain Redness, tenderness, or signs of infection (pain, swelling, redness, odor or green/yellow discharge around the site) Difficulty breathing, headache or visual disturbances Hives Persistent dizziness or light-headedness Extreme fatigue Any other questions or concerns you may have after discharge  In an emergency, call 911 or go to an Emergency Department at a nearby hospital  HELPFUL INFORMATION  If you had a block, it will wear off between 8-24 hrs postop typically.  This is period when your pain may go from nearly zero to the pain you would have had postop without the block.  This is an abrupt transition but nothing dangerous is happening.  You may take an extra dose of narcotic when this happens.  You should wean off your narcotic medicines as soon as you are able.  Most patients will be off or using minimal narcotics before their first postop appointment.   We suggest you use the pain medication the first night prior to going to bed, in order to ease any pain when the anesthesia wears off. You should avoid taking pain medications on an empty stomach as it will make you nauseous.  Do not drink alcoholic beverages or take illicit drugs when taking pain medications.  In most states it is against the law to drive while you are in a splint or sling.  And certainly against the law to drive while taking narcotics.  You may return to work/school in the next couple of days when you feel up to it.   Pain medication may make you constipated.  Below are a few solutions to try in this order: Decrease the amount of pain medication if you aren't having pain. Drink lots of decaffeinated fluids. Drink prune juice and/or each dried prunes  If the first 3 don't work start with additional solutions Take Colace - an over-the-counter stool softener Take Senokot - an over-the-counter laxative Take Miralax - a stronger over-the-counter laxative     CALL  THE OFFICE WITH ANY QUESTIONS OR CONCERNS: [phone]   VISIT OUR WEBSITE FOR ADDITIONAL INFORMATION: [email or web address]   Driving restrictions   Complete by: As directed    No driving   Increase activity slowly as tolerated   Complete by: As directed    Non weight bearing   Complete by: As directed    Laterality: left   Extremity: Lower     Allergies as of 11/05/2020  No Known Allergies     Medication List    TAKE these medications   acetaminophen 325 MG tablet Commonly known as: TYLENOL Take 2 tablets (650 mg total) by mouth every 12 (twelve) hours.   ascorbic acid 1000 MG tablet Commonly known as: VITAMIN C Take 1 tablet (1,000 mg total) by mouth daily. Start taking on: November 06, 2020   docusate sodium 100 MG capsule Commonly known as: COLACE Take 1 capsule (100 mg total) by mouth 2 (two) times daily.   hydrochlorothiazide 12.5 MG capsule Commonly known as: MICROZIDE Take 1 capsule (12.5 mg total) by mouth daily.   HYDROcodone-acetaminophen 5-325 MG tablet Commonly known as: NORCO/VICODIN Take 1-2 tablets by mouth every 6 (six) hours as needed for moderate pain or severe pain.   magnesium citrate Soln Take 296 mLs (1 Bottle total) by mouth once as needed for severe constipation.   methocarbamol 500 MG tablet Commonly known as: ROBAXIN Take 1-2 tablets (500-1,000 mg total) by mouth every 8 (eight) hours.   rivaroxaban 10 MG Tabs tablet Commonly known as: XARELTO Take 1 tablet (10 mg total) by mouth daily with supper.   tamsulosin 0.4 MG Caps capsule Commonly known as: FLOMAX Take 1 capsule (0.4 mg total) by mouth daily. Start taking on: November 06, 2020   Vitamin D 125 MCG (5000 UT) Caps Take 1 capsule by mouth daily.            Durable Medical Equipment  (From admission, onward)         Start     Ordered   11/02/20 1038  For home use only DME Walker rolling  Once       Question Answer Comment  Walker: With 5 Inch Wheels   Patient needs a  walker to treat with the following condition Fx      11/02/20 1041   11/02/20 1037  For home use only DME 3 n 1  Once        11/02/20 1041           Discharge Care Instructions  (From admission, onward)         Start     Ordered   11/05/20 0000  Non weight bearing       Question Answer Comment  Laterality left   Extremity Lower      11/05/20 1138          Follow-up Information    [facility], [facility] Follow [facility].   Specialty: Home Health Services Why: [facility], start of care within 48 hours post discharge Contact information: [address] [place] [address] [phone]        Myrene GalasHandy, Michael, MD. Schedule an appointment as soon as possible for a visit in 10 day(s).   Specialty: Orthopedic Surgery Contact information: [address] [place] [address] [phone]        Alvia Groveidge, [facility]. Schedule an appointment as soon as possible for a visit in 2 week(s).   Why: follow up on high blood pressure  Contact information: [address] [place] [address] [phone]               Discharge Instructions and Plan: ***  Weightbearing: {weightbearing/status:21254} {affected extremity :21255} Insicional and dressing care: {Insicional and dressing care:21256} Orthopedic device(s): {CHL orthopedic devices:21267::"None"} Showering: *** VTE prophylaxis: {Type:21257} {Duration:21258} Pain control: *** Bone Health/Optimization: *** Follow - up plan: {Follow-up  plan:21259} Contact information:  *** MD, *** PA   {CZYS:0630160}  Signed:  [name], PA-C [phone] (C) 11/05/2020, 11:39 AM  [facility] [address] [place] [address] [phone] [name] (F)

## 2020-11-05 NOTE — Discharge Summary (Signed)
Orthopaedic Trauma Service (OTS) Discharge Summary   Patient ID: Antonio Harrison MRN: [id number] DOB/AGE: 1970/09/10 50 y.o.  Admit date: 10/30/2020 Discharge date: 11/05/2020  Admission Diagnoses: Left bicondylar tibial plateau fracture  Discharge Diagnoses:  Principal Problem:   Closed bicondylar fracture of left tibial plateau Active Problems:   Vitamin D deficiency   HTN (hypertension)   Past Medical History:  Diagnosis Date   Acute urinary retention post op 10/31/2020   Started flomax.  Patient had catheter placed post op   Vitamin D deficiency 11/05/2020     Procedures Performed: 10/30/2020- Dr. Carola Frost 1. CLOSED REDUCTION OF LEFT TIBIAL PLATEAU 2. APPLICATION OF SPANNING EXTERNAL FIXATOR LEFT KNEE 3. ASPIRATION OF LEFT KNEE JOINT  11/01/2020- Dr. Carola Frost  1. ORIF OF LEFT BICONDYLAR TIBIAL PLATEAU FRACTURE  2. ORIF TIBIAL EMINENCE 3. ARTHROTOMY WITH LATERAL MENISCECTOMY 4. ANTERIOR COMPARTMENT FASCIOTOMY 5. REMOVAL OF EXTERNAL FIXATOR UNDER ANESTHESIA 6. MANUAL APPLICATION OF STRESS UNDER FLUOROSCOPY  Discharged Condition: good  Hospital Course:   50 year old male admitted on 10/30/2020 after sustaining an injury to his left knee while jumping over a guardrail at a concert. Patient was found to have a complex left tibial plateau fracture. He was initially taken to the OR for close reduction and application of a spanning external fixator to temporize his injury and to allow for more definitive surgical planning. This was done and patient was taken back to the OR on 11/01/2020 where ORIF of his complex left bicondylar tibial plateau fracture was performed. Patient had extensive injury to his joint surface as well as his lateral meniscus. Patient tolerated this procedure as well after surgery was transferred to the PACU for care from anesthesia and transferred back up to the orthopedic floor for observation and pain control. Patient did not have any significant  issues during his hospital stay. Incidentally it does appear that he does have some undiagnosed hypertension. He will need to follow-up with his primary care physician in the next week or two to further evaluate this. He will be discharged on HCTZ 12.5 mg daily in the interim. Patient started with therapies on postoperative day #1 and progressed well. Again no issues were noted. He was fitted for hinged knee brace. He'll be nonweightbearing for 8 weeks. Will get a hold off on knee range of motion for 2 weeks and then begin unrestricted range of motion of his left knee. He will likely require early total knee replacement given the magnitude of injury to his joint surface. Patient was initially started on Lovenox for DVT prophylaxis and this was converted to Xarelto. He will be on anticoagulation for 4 weeks. Wound care was reviewed with the patient and his wife. He was discharged in stable condition on 11/05/2020  Of note he did have some acute urinary retention postop he was started on bethanechol and Flomax. This resolved on postoperative day #2. Bethanechol was DC'd at this time and he will continue on Flomax for 7 days post discharge  At the time of discharge patient was tolerating a diet. Had a bowel movement and was mobilizing well  Labs did show some vitamin D deficiency. He was started on supplementation as well and will continue this indefinitely  Consults: None  Significant Diagnostic Studies: labs:   Results for Antonio Harrison, Antonio Harrison (MRN [id number]) as of 11/05/2020 11:50  Ref. Range 11/03/2020 04:23  WBC Latest Ref Range: 4.0 - 10.5 K/uL 7.5  RBC Latest Ref Range: 4.22 - 5.81 MIL/uL 3.77 (L)  Hemoglobin Latest Ref Range: 13.0 - 17.0 g/dL 11.9 (L)  HCT Latest Ref Range: 39.0 - 52.0 % 32.4 (L)  MCV Latest Ref Range: 80.0 - 100.0 fL 85.9  MCH Latest Ref Range: 26.0 - 34.0 pg 29.2  MCHC Latest Ref Range: 30.0 - 36.0 g/dL 14.7  RDW Latest Ref Range: 11.5 - 15.5 % 12.6  Platelets Latest Ref Range:  150 - 400 K/uL 182  nRBC Latest Ref Range: 0.0 - 0.2 % 0.0  Results for Antonio Harrison, Antonio Harrison (MRN [id number]) as of 11/05/2020 11:50  Ref. Range 10/31/2020 06:00  Vitamin D, 25-Hydroxy Latest Ref Range: 30 - 100 ng/mL 20.43 (L)   Results for Antonio Harrison, Antonio Harrison (MRN [id number]) as of 11/05/2020 11:50  Ref. Range 10/30/2020 18:32  Sodium Latest Ref Range: 135 - 145 mmol/L 137  Potassium Latest Ref Range: 3.5 - 5.1 mmol/L 4.0  Chloride Latest Ref Range: 98 - 111 mmol/L 99  CO2 Latest Ref Range: 22 - 32 mmol/L 26  Glucose Latest Ref Range: 70 - 99 mg/dL 295 (H)  BUN Latest Ref Range: 6 - 20 mg/dL 15  Creatinine Latest Ref Range: 0.61 - 1.24 mg/dL 2.84  Calcium Latest Ref Range: 8.9 - 10.3 mg/dL 9.0  Anion gap Latest Ref Range: 5 - 15  12  GFR, Estimated Latest Ref Range: >60 mL/min >60   Treatments: IV hydration, antibiotics: Ancef, analgesia: acetaminophen, Dilaudid and Norco, anticoagulation: LMW heparin and converted to Xarelto on postop day two, therapies: PT, OT and RN and surgery: As above  Discharge Exam:   Orthopaedic Trauma Service Progress Note   Patient ID: Antonio Harrison MRN: [id number] DOB/AGE: 03/04/1971 50 y.o.   Subjective:   Doing well Ready to go home today  No actue issues    BPs are elevated No history of HTN Needs follow up with PCP    Denies headache or vision changes    Pt did have TED hose on yesterday and it was taken off at bedtime    ROS As above   Objective:    VITALS:         Vitals:    11/04/20 1419 11/04/20 2000 11/05/20 0349 11/05/20 0754  BP: (!) 165/108 (!) 156/88 (!) 152/92 (!) 151/100  Pulse: 96 88 88 89  Resp: Temp: (!) 97.4 F (36.3 C) 98 F (36.7 C) 98.3 F (36.8 C) 97.9 F (36.6 C)  TempSrc: Oral Oral      SpO2: 97% 98% 95% 97%  Weight:          Height:              Estimated body mass index is 32.87 kg/m as calculated from the following:   Height as of this encounter:  (1.93 m).   Weight as of this  encounter: 122.5 kg.     Intake/Output      03/03 0701 03/04 0700 03/04 0701 03/05 0700   P.O. 1080 240   I.V. (mL/kg)     Total Intake(mL/kg) 1080 (8.8) 240 (2)   Urine (mL/kg/hr) 1850 (0.6) 400 (0.7)   Stool     Total Output 1850 400   Net -770 -160           LABS   Lab Results Last 24 Hours  No results found for this or any previous visit (from the past 24 hour(s)).       PHYSICAL EXAM:    Gen: appears well, no  acute distress, sitting up in bed Lungs: unlabored Cardiac: RRR Abd: + BS, NTND Ext:       Left Lower Extremity              Dressing clean, dry and intact             Hinged knee braces on and fitting well.  It is locked in full extension             Dressings removed                         All incisions are healing nicely.  Some minimal serosanguineous drainage from his lateral wound                         No erythema             Ext warm              Minimal swelling             No DCT             Compartments soft, no pain out of proportion with passive stretch              Motor and sensory functions intact                Assessment/Plan: 4 Days Post-Op    Principal Problem:   Closed bicondylar fracture of left tibial plateau Active Problems:   Vitamin D deficiency   HTN (hypertension)                Anti-infectives (From admission, onward)      Start     Dose/Rate Route Frequency Ordered Stop    11/02/20 0200   ceFAZolin (ANCEF) IVPB 2g/100 mL premix        2 g 200 mL/hr over 30 Minutes Intravenous Every 8 hours 11/01/20 2051 11/02/20 1919    11/01/20 1644   vancomycin (VANCOCIN) powder  Status:  Discontinued            As needed 11/01/20 1645 11/01/20 1855    11/01/20 1015   ceFAZolin (ANCEF) 3 g in dextrose 5 % 50 mL IVPB        3 g 100 mL/hr over 30 Minutes Intravenous On call to O.R. 11/01/20 0916 11/02/20 0559    10/31/20 0400   ceFAZolin (ANCEF) IVPB 2g/100 mL premix        2 g 200 mL/hr over 30 Minutes Intravenous Every 6  hours 10/31/20 0157 10/31/20 1753    10/30/20 2115   ceFAZolin (ANCEF) IVPB 2g/100 mL premix        2 g 200 mL/hr over 30 Minutes Intravenous On call to O.R. 10/30/20 2056 10/30/20 2232       .   POD/HD#: 59  50 y/o male s/p fall with complex L bicondylar tibial plateau fracture   -comminuted L bicondylar tibial plateau fracture with severe injury to lateral articular surface s/p ORIF and lateral menisectomy              NWB L leg x 8 weeks             Hinged brace but locked in full extension x 2 weeks then begin unrestricted ROM              Ice and elevate  Dressing changed                         New Mepilex applied                         Thigh-high TED hose                         Change daily as needed                         Okay to shower and clean with soap and water only             PT/OT                          Given the amount of injury present pt will need TKA in the near future    - Pain management:             Multimodal   - ABL anemia/Hemodynamics             Stable    - Medical issues              Vitamin d deficinecy                          Supplement                Acute urinary retention                         Continue Flomax x 7 days                          Monitor                         Mobilize              - DVT/PE prophylaxis:             Xarelto x 4 weeks   - ID:              periop abx completed    - Metabolic Bone Disease:             As above   - Activity:             Up with therapies    - FEN/GI prophylaxis/Foley/Lines:             Reg diet            dc iv    - Impediments to fracture healing:             Severe soft tissue envelope injury   - Dispo:             dc home today              Follow up with ortho in 10-14 days   Disposition: Discharge disposition: 01-Home or Self Care       Discharge Instructions    Call MD / Call 911   Complete by: As directed    If you experience chest pain or  shortness of breath, CALL 911 and be transported to the hospital emergency room.  If you develope  a fever above 101 F, pus (white drainage) or increased drainage or redness at the wound, or calf pain, call your surgeon's office.   Constipation Prevention   Complete by: As directed    Drink plenty of fluids.  Prune juice may be helpful.  You may use a stool softener, such as Colace (over the counter) 100 mg twice a day.  Use MiraLax (over the counter) for constipation as needed.   Diet general   Complete by: As directed    Discharge instructions   Complete by: As directed    Orthopaedic Trauma Service Discharge Instructions   General Discharge Instructions  Orthopaedic Injuries:  Left tibial plateau fracture treated with open reduction and internal fixation using plate and screws   WEIGHT BEARING STATUS: Nonweightbearing Left leg   RANGE OF MOTION/ACTIVITY: no knee motion of left knee at this time. Wear knee brace   Bone health:  Labs show vitamin d deficiency. Take vitamin supplements as prescribed   Wound Care: daily wound care as needed starting when you get home. See below. Use TED hose for swelling control as well  Discharge Wound Care Instructions  Do NOT apply any ointments, solutions or lotions to pin sites or surgical wounds.  These prevent needed drainage and even though solutions like hydrogen peroxide kill bacteria, they also damage cells lining the pin sites that help fight infection.  Applying lotions or ointments can keep the wounds moist and can cause them to breakdown and open up as well. This can increase the risk for infection. When in doubt call the office.  Surgical incisions should be dressed daily.  If any drainage is noted, use one layer of adaptic, then gauze, Kerlix, and an ace wrap. Alternatively you can use mepliex type dressing (this is what you currently have on) or 4x4 gauze and tape   Once the incision is completely dry and without drainage, it may be  left open to air out.  Showering may begin 36-48 hours later.  Cleaning gently with soap and water.   DVT/PE prophylaxis: Xarelto daily x 4 weeks   Diet: as you were eating previously.  Can use over the counter stool softeners and bowel preparations, such as Miralax, to help with bowel movements.  Narcotics can be constipating.  Be sure to drink plenty of fluids  PAIN MEDICATION USE AND EXPECTATIONS  You have likely been given narcotic medications to help control your pain.  After a traumatic event that results in an fracture (broken bone) with or without surgery, it is ok to use narcotic pain medications to help control one's pain.  We understand that everyone responds to pain differently and each individual patient will be evaluated on a regular basis for the continued need for narcotic medications. Ideally, narcotic medication use should last no more than 6-8 weeks (coinciding with fracture healing).   As a patient it is your responsibility as well to monitor narcotic medication use and report the amount and frequency you use these medications when you come to your office visit.   We would also advise that if you are using narcotic medications, you should take a dose prior to therapy to maximize you participation.  IF YOU ARE ON NARCOTIC MEDICATIONS IT IS NOT PERMISSIBLE TO OPERATE A MOTOR VEHICLE (MOTORCYCLE/CAR/TRUCK/MOPED) OR HEAVY MACHINERY DO NOT MIX NARCOTICS WITH OTHER CNS (CENTRAL NERVOUS SYSTEM) DEPRESSANTS SUCH AS ALCOHOL   STOP SMOKING OR USING NICOTINE PRODUCTS!!!!  As discussed nicotine severely impairs your body's ability to heal surgical  and traumatic wounds but also impairs bone healing.  Wounds and bone heal by forming microscopic blood vessels (angiogenesis) and nicotine is a vasoconstrictor (essentially, shrinks blood vessels).  Therefore, if vasoconstriction occurs to these microscopic blood vessels they essentially disappear and are unable to deliver necessary nutrients to  the healing tissue.  This is one modifiable factor that you can do to dramatically increase your chances of healing your injury.    (This means no smoking, no nicotine gum, patches, etc)  DO NOT USE NONSTEROIDAL ANTI-INFLAMMATORY DRUGS (NSAID'S)  Using products such as Advil (ibuprofen), Aleve (naproxen), Motrin (ibuprofen) for additional pain control during fracture healing can delay and/or prevent the healing response.  If you would like to take over the counter (OTC) medication, Tylenol (acetaminophen) is ok.  However, some narcotic medications that are given for pain control contain acetaminophen as well. Therefore, you should not exceed more than 4000 mg of tylenol in a day if you do not have liver disease.  Also note that there are may OTC medicines, such as cold medicines and allergy medicines that my contain tylenol as well.  If you have any questions about medications and/or interactions please ask your doctor/PA or your pharmacist.      ICE AND ELEVATE INJURED/OPERATIVE EXTREMITY  Using ice and elevating the injured extremity above your heart can help with swelling and pain control.  Icing in a pulsatile fashion, such as 20 minutes on and 20 minutes off, can be followed.    Do not place ice directly on skin. Make sure there is a barrier between to skin and the ice pack.    Using frozen items such as frozen peas works well as the conform nicely to the are that needs to be iced.  USE AN ACE WRAP OR TED HOSE FOR SWELLING CONTROL  In addition to icing and elevation, Ace wraps or TED hose are used to help limit and resolve swelling.  It is recommended to use Ace wraps or TED hose until you are informed to stop.    When using Ace Wraps start the wrapping distally (farthest away from the body) and wrap proximally (closer to the body)   Example: If you had surgery on your leg or thing and you do not have a splint on, start the ace wrap at the toes and work your way up to the thigh        If you had  surgery on your upper extremity and do not have a splint on, start the ace wrap at your fingers and work your way up to the upper arm  IF YOU ARE IN A SPLINT OR CAST DO NOT REMOVE IT FOR ANY REASON   If your splint gets wet for any reason please contact the office immediately. You may shower in your splint or cast as long as you keep it dry.  This can be done by wrapping in a cast cover or garbage back (or similar)  Do Not stick any thing down your splint or cast such as pencils, money, or hangers to try and scratch yourself with.  If you feel itchy take benadryl as prescribed on the bottle for itching  IF YOU ARE IN A CAM BOOT (BLACK BOOT)  You may remove boot periodically. Perform daily dressing changes as noted below.  Wash the liner of the boot regularly and wear a sock when wearing the boot. It is recommended that you sleep in the boot until told otherwise    Call  office for the following: Temperature greater than 101F Persistent nausea and vomiting Severe uncontrolled pain Redness, tenderness, or signs of infection (pain, swelling, redness, odor or green/yellow discharge around the site) Difficulty breathing, headache or visual disturbances Hives Persistent dizziness or light-headedness Extreme fatigue Any other questions or concerns you may have after discharge  In an emergency, call 911 or go to an Emergency Department at a nearby hospital  HELPFUL INFORMATION  If you had a block, it will wear off between 8-24 hrs postop typically.  This is period when your pain may go from nearly zero to the pain you would have had postop without the block.  This is an abrupt transition but nothing dangerous is happening.  You may take an extra dose of narcotic when this happens.  You should wean off your narcotic medicines as soon as you are able.  Most patients will be off or using minimal narcotics before their first postop appointment.   We suggest you use the pain medication the first  night prior to going to bed, in order to ease any pain when the anesthesia wears off. You should avoid taking pain medications on an empty stomach as it will make you nauseous.  Do not drink alcoholic beverages or take illicit drugs when taking pain medications.  In most states it is against the law to drive while you are in a splint or sling.  And certainly against the law to drive while taking narcotics.  You may return to work/school in the next couple of days when you feel up to it.   Pain medication may make you constipated.  Below are a few solutions to try in this order: Decrease the amount of pain medication if you aren't having pain. Drink lots of decaffeinated fluids. Drink prune juice and/or each dried prunes  If the first 3 don't work start with additional solutions Take Colace - an over-the-counter stool softener Take Senokot - an over-the-counter laxative Take Miralax - a stronger over-the-counter laxative     CALL THE OFFICE WITH ANY QUESTIONS OR CONCERNS: [phone]   VISIT OUR WEBSITE FOR ADDITIONAL INFORMATION: [email or web address]   Driving restrictions   Complete by: As directed    No driving   Increase activity slowly as tolerated   Complete by: As directed    Non weight bearing   Complete by: As directed    Laterality: left   Extremity: Lower     Allergies as of 11/05/2020   No Known Allergies     Medication List    TAKE these medications   acetaminophen 325 MG tablet Commonly known as: TYLENOL Take 2 tablets (650 mg total) by mouth every 12 (twelve) hours.   ascorbic acid 1000 MG tablet Commonly known as: VITAMIN C Take 1 tablet (1,000 mg total) by mouth daily. Start taking on: November 06, 2020   docusate sodium 100 MG capsule Commonly known as: COLACE Take 1 capsule (100 mg total) by mouth 2 (two) times daily.   hydrochlorothiazide 12.5 MG capsule Commonly known as: MICROZIDE Take 1 capsule (12.5 mg total) by mouth daily.    HYDROcodone-acetaminophen 5-325 MG tablet Commonly known as: NORCO/VICODIN Take 1-2 tablets by mouth every 6 (six) hours as needed for moderate pain or severe pain.   magnesium citrate Soln Take 296 mLs (1 Bottle total) by mouth once as needed for severe constipation.   methocarbamol 500 MG tablet Commonly known as: ROBAXIN Take 1-2 tablets (500-1,000 mg total) by mouth every 8 (eight) hours.  rivaroxaban 10 MG Tabs tablet Commonly known as: XARELTO Take 1 tablet (10 mg total) by mouth daily with supper.   tamsulosin 0.4 MG Caps capsule Commonly known as: FLOMAX Take 1 capsule (0.4 mg total) by mouth daily. Start taking on: November 06, 2020   Vitamin D 125 MCG (5000 UT) Caps Take 1 capsule by mouth daily.            Durable Medical Equipment  (From admission, onward)         Start     Ordered   11/02/20 1038  For home use only DME Walker rolling  Once       Question Answer Comment  Walker: With 5 Inch Wheels   Patient needs a walker to treat with the following condition Fx      11/02/20 1041   11/02/20 1037  For home use only DME 3 n 1  Once        11/02/20 1041           Discharge Care Instructions  (From admission, onward)         Start     Ordered   11/05/20 0000  Non weight bearing       Question Answer Comment  Laterality left   Extremity Lower      11/05/20 1138          Follow-up Information    Care, [facility] Follow up.   Specialty: Home Health Services Why: Home health services arranged with [facility], start of care within 48 hours post discharge Contact information: [address] [place] [address] [phone]        Myrene Galas, MD. Schedule an appointment as soon as possible for a visit in 10 day(s).   Specialty: Orthopedic Surgery Contact information: [address] [place] [address] [phone]        [facility]. Schedule an appointment as soon as  possible for a visit in 2 week(s).   Why: follow up on high blood pressure  Contact information: [address] [place] [address] [phone]               Discharge Instructions and Plan:  50 y/o male s/p fall with complex L bicondylar tibial plateau fracture  Weightbearing: NWB LLE Insicional and dressing care: Daily dressing changes with 4x4s and tape or mepilex. TED hose over top of dressing  Orthopedic device(s): Hinged knee brace and walker. Wheelchair for long distance Showering: Okay to shower and clean with soap and water VTE prophylaxis: Xarelto 10 mg daily x 4 weeks . Pain control: Multimodal Bone Health/Optimization: Vitamin D3 5000 IUs daily Follow - up plan: 10-14 days Contact information:  Myrene Galas MD, Montez Morita PA-C   Signed:  [name], PA-C [phone] (C) 11/05/2020, 11:44 AM  [facility] [address] [place] [address] [phone] [name] (F)

## 2020-11-05 NOTE — TOC Progression Note (Signed)
Transition of Care [facility]) - Progression Note    Patient Details  Name: Antonio Harrison MRN: [id number] Date of Birth: October 17, 1970  Transition of Care [facility]) CM/SW Contact  Epifanio Lesches, RN Phone Number: 11/05/2020, 10:38 AM  Clinical Narrative:        -s/p surgery for ex fix placement on left leg 2/28 / fracture of left tibial plateau Per MD pt ready for d/c to home today Pt and wife requesting ambulance transportation to home.. patient unable to bend knee. NCM called [facility] to arrange. [facility] stated pt needs prior authorization in order to provide transportation to home. NCM called [employer] [phone]). [employer] informed NCM pt's insurance plan will not cover transportation to home by ambulance, NCM made pt and wife aware. Pt /wife stated they will figure out  A different plan.   Expected Discharge Plan: Home w Home Health Services Barriers to Discharge: No Barriers Identified  Expected Discharge Plan and Services Expected Discharge Plan: Home w Home Health Services   Discharge Planning Services: CM Consult                               HH Arranged: PT [facility] Agency: [facility] Date [facility] Agency Contacted: 11/02/20 Time HH Agency Contacted: 1019 Representative spoke with at [facility] Agency: Kandee Keen   Social Determinants of Health (SDOH) Interventions    Readmission Risk Interventions No flowsheet data found.

## 2020-11-05 NOTE — Progress Notes (Signed)
Physical Therapy Treatment Patient Details Name: Antonio Harrison MRN: [id number] DOB: 1971/07/22 Today's Date: 11/05/2020    History of Present Illness Antonio Harrison is an 50 y.o. male. Who fell going to [facility] concert general admission seating after jumping a guardrail, sustaining a Left tibial plateau fracture with severe comminution and joint subluxation. Pt now s/p LLE ex fix (NWB LLE).    PT Comments    Pt seated in recliner.  Pt continues to benefit from skilled rehab in home setting.  Progress noted in transfers.  Pt required assistance for technique to place TED hose.  Pt issued HEP to keep dominant leg strengthened.  Pt also give exercises to L hip in standing.  Issued gt belt, wife present for education during session.      Follow Up Recommendations  Home health PT;Supervision - Intermittent     Equipment Recommendations  Rolling walker with 5" wheels;3in1 (PT);Wheelchair (measurements PT);Wheelchair cushion (measurements PT);Hospital bed    Recommendations for Other Services       Precautions / Restrictions Precautions Precautions: Fall Precaution Comments: NWB LLE Required Braces or Orthoses: Other Brace Other Brace: hinged brace locked in extension Restrictions Weight Bearing Restrictions: Yes LLE Weight Bearing: Non weight bearing Other Position/Activity Restrictions: LLE NWB with ex fix    Mobility  Bed Mobility               General bed mobility comments: pt in recliner upon arrival, mod A with LEs back onto bed    Transfers Overall transfer level: Needs assistance Equipment used: Rolling walker (2 wheeled) Transfers: Sit to/from Stand Sit to Stand: Min guard         General transfer comment: Cues for hand placement able to maintain weight bearing well rising from chair.  Performed x 2 reps.  Ambulation/Gait Ambulation/Gait assistance: Supervision Gait Distance (Feet): 4 Feet Assistive device: Rolling walker (2 wheeled) Gait  Pattern/deviations: Step-to pattern Gait velocity: reduced   General Gait Details: Good maintenance of NWB on L leg, relying on RW for bil UE support for hop to forward away from chair and backward back to chair.   Stairs             Wheelchair Mobility    Modified Rankin (Stroke Patients Only)       Balance Overall balance assessment: Needs assistance Sitting-balance support: No upper extremity supported;Feet supported Sitting balance-Leahy Scale: Good     Standing balance support: Bilateral upper extremity supported;During functional activity Standing balance-Leahy Scale: Poor                              Cognition   Behavior During Therapy: WFL for tasks assessed/performed Overall Cognitive Status: Within Functional Limits for tasks assessed                                        Exercises General Exercises - Lower Extremity Ankle Circles/Pumps: AROM;Both;10 reps;Supine Quad Sets: AROM;Right;10 reps;Supine Heel Slides: AROM;Right;10 reps;Supine Hip ABduction/ADduction: AROM;Right;10 reps;Supine Straight Leg Raises: AROM;Right;10 reps;Supine Other Exercises Other Exercises: 3 way hip in standing to LLE.  Hip abd/add, hip extension, hip flexion 1x10 reps each direction.    General Comments        Pertinent Vitals/Pain Pain Assessment: Faces Faces Pain Scale: Hurts even more Pain Location: LLE knee Pain Descriptors / Indicators: Aching;Grimacing;Guarding Pain  Intervention(s): Monitored during session;Repositioned;Ice applied    Home Living                      Prior Function            PT Goals (current goals can now be found in the care plan section) Acute Rehab PT Goals Patient Stated Goal: to return home Potential to Achieve Goals: Good Progress towards PT goals: Progressing toward goals    Frequency    Min 4X/week      PT Plan Current plan remains appropriate;Equipment recommendations need to be  updated    Co-evaluation              AM-PAC PT "6 Clicks" Mobility   Outcome Measure  Help needed turning from your back to your side while in a flat bed without using bedrails?: A Little Help needed moving from lying on your back to sitting on the side of a flat bed without using bedrails?: A Little Help needed moving to and from a bed to a chair (including a wheelchair)?: A Little Help needed standing up from a chair using your arms (e.g., wheelchair or bedside chair)?: A Little Help needed to walk in hospital room?: A Little Help needed climbing 3-5 steps with a railing? : A Little 6 Click Score: 18    End of Session Equipment Utilized During Treatment: Gait belt Activity Tolerance: Patient tolerated treatment well Patient left: in chair;with call bell/phone within reach;with family/visitor present Nurse Communication: Mobility status PT Visit Diagnosis: Unsteadiness on feet (R26.81);Other abnormalities of gait and mobility (R26.89);Muscle weakness (generalized) (M62.81);Difficulty in walking, not elsewhere classified (R26.2)     Time: 3710-6269 PT Time Calculation (min) (ACUTE ONLY): 47 min  Charges:  $Therapeutic Exercise: 23-37 mins $Therapeutic Activity: 8-22 mins                     Bonney Leitz , PTA Acute Rehabilitation Services Pager [phone] Office [phone]     Aimee Artis Delay 11/05/2020, 2:33 PM

## 2020-11-05 NOTE — Progress Notes (Signed)
Orthopaedic Trauma Service Progress Note  Patient ID: Antonio Harrison MRN: [id number] DOB/AGE: Jan 08, 1971 50 y.o.  Subjective:  Doing well Ready to go home today  No actue issues   BPs are elevated No history of HTN Needs follow up with PCP   Denies headache or vision changes   Pt did have TED hose on yesterday and it was taken off at bedtime   ROS As above  Objective:   VITALS:   Vitals:   11/04/20 1419 11/04/20 2000 11/05/20 0349 11/05/20 0754  BP: (!) 165/108 (!) 156/88 (!) 152/92 (!) 151/100  Pulse: 96 88 88 89  Resp: 18 18 17 18   Temp: (!) 97.4 F (36.3 C) 98 F (36.7 C) 98.3 F (36.8 C) 97.9 F (36.6 C)  TempSrc: Oral Oral    SpO2: 97% 98% 95% 97%  Weight:      Height:        Estimated body mass index is 32.87 kg/m as calculated from the following:   Height as of this encounter: 6\' 4"  (1.93 m).   Weight as of this encounter: 122.5 kg.   Intake/Output      03/03 0701 03/04 0700 03/04 0701 03/05 0700   P.O. 1080 240   I.V. (mL/kg)     Total Intake(mL/kg) 1080 (8.8) 240 (2)   Urine (mL/kg/hr) 1850 (0.6) 400 (0.7)   Stool     Total Output 1850 400   Net -770 -160          LABS  No results found for this or any previous visit (from the past 24 hour(s)).   PHYSICAL EXAM:   Gen: appears well, no acute distress, sitting up in bed Lungs: unlabored Cardiac: RRR Abd: + BS, NTND Ext:       Left Lower Extremity              Dressing clean, dry and intact             Hinged knee braces on and fitting well.  It is locked in full extension             Dressings removed                         All incisions are healing nicely.  Some minimal serosanguineous drainage from his lateral wound                         No erythema             Ext warm              Minimal swelling             No DCT             Compartments soft, no pain out of proportion with passive stretch               Motor and sensory functions intact               Assessment/Plan: 4 Days Post-Op   Principal Problem:   Closed bicondylar fracture of left tibial plateau Active Problems:   Vitamin D deficiency   HTN (hypertension)   Anti-infectives (From admission, onward)   Start  Dose/Rate Route Frequency Ordered Stop   11/02/20 0200  ceFAZolin (ANCEF) IVPB 2g/100 mL premix        2 g 200 mL/hr over 30 Minutes Intravenous Every 8 hours 11/01/20 2051 11/02/20 1919   11/01/20 1644  vancomycin (VANCOCIN) powder  Status:  Discontinued          As needed 11/01/20 1645 11/01/20 1855   11/01/20 1015  ceFAZolin (ANCEF) 3 g in dextrose 5 % 50 mL IVPB        3 g 100 mL/hr over 30 Minutes Intravenous On call to O.R. 11/01/20 0916 11/02/20 0559   10/31/20 0400  ceFAZolin (ANCEF) IVPB 2g/100 mL premix        2 g 200 mL/hr over 30 Minutes Intravenous Every 6 hours 10/31/20 0157 10/31/20 1753   10/30/20 2115  ceFAZolin (ANCEF) IVPB 2g/100 mL premix        2 g 200 mL/hr over 30 Minutes Intravenous On call to O.R. 10/30/20 2056 10/30/20 2232    .  POD/HD#: 27 50 y/o male s/p fall with complex L bicondylar tibial plateau fracture   -comminuted L bicondylar tibial plateau fracture with severe injury to lateral articular surface s/p ORIF and lateral menisectomy              NWB L leg x 8 weeks             Hinged brace but locked in full extension x 2 weeks then begin unrestricted ROM              Ice and elevate             Dressing changed                         New Mepilex applied                         Thigh-high TED hose                         Change daily as needed                         Okay to shower and clean with soap and water only             PT/OT                          Given the amount of injury present pt will need TKA in the near future    - Pain management:             Multimodal  - ABL anemia/Hemodynamics             Stable    - Medical issues               Vitamin d deficinecy                          Supplement                Acute urinary retention                         Continue Flomax x 7 days  Monitor                         Mobilize              - DVT/PE prophylaxis:             Xarelto x 4 weeks   - ID:              periop abx completed    - Metabolic Bone Disease:             As above   - Activity:             Up with therapies    - FEN/GI prophylaxis/Foley/Lines:             Reg diet            dc iv   - Impediments to fracture healing:             Severe soft tissue envelope injury   - Dispo:             dc home today   Follow up with ortho in 10-14 days    [name], PA-C [phone] (C) 11/05/2020, 11:24 AM  [facility] [address] [place] [address] [phone] [phone] (F)    After 5pm and on the weekends please log on to Amion, go to orthopaedics and the look under the [facility] Call for the provider(s) on call. You can also call our office at [phone] and then follow the prompts to be connected to the call team.

## 2020-11-09 DIAGNOSIS — E559 Vitamin D deficiency, unspecified: Secondary | ICD-10-CM | POA: Diagnosis not present

## 2020-11-09 DIAGNOSIS — Z9181 History of falling: Secondary | ICD-10-CM | POA: Diagnosis not present

## 2020-11-09 DIAGNOSIS — Z7901 Long term (current) use of anticoagulants: Secondary | ICD-10-CM | POA: Diagnosis not present

## 2020-11-09 DIAGNOSIS — I10 Essential (primary) hypertension: Secondary | ICD-10-CM | POA: Diagnosis not present

## 2020-11-09 DIAGNOSIS — S82142D Displaced bicondylar fracture of left tibia, subsequent encounter for closed fracture with routine healing: Secondary | ICD-10-CM | POA: Diagnosis not present

## 2020-11-09 DIAGNOSIS — R339 Retention of urine, unspecified: Secondary | ICD-10-CM | POA: Diagnosis not present

## 2020-11-16 DIAGNOSIS — I10 Essential (primary) hypertension: Secondary | ICD-10-CM | POA: Diagnosis not present

## 2020-11-16 DIAGNOSIS — Z9181 History of falling: Secondary | ICD-10-CM | POA: Diagnosis not present

## 2020-11-16 DIAGNOSIS — R339 Retention of urine, unspecified: Secondary | ICD-10-CM | POA: Diagnosis not present

## 2020-11-16 DIAGNOSIS — S82142D Displaced bicondylar fracture of left tibia, subsequent encounter for closed fracture with routine healing: Secondary | ICD-10-CM | POA: Diagnosis not present

## 2020-11-16 DIAGNOSIS — E559 Vitamin D deficiency, unspecified: Secondary | ICD-10-CM | POA: Diagnosis not present

## 2020-11-16 DIAGNOSIS — Z7901 Long term (current) use of anticoagulants: Secondary | ICD-10-CM | POA: Diagnosis not present

## 2020-11-17 DIAGNOSIS — S83282D Other tear of lateral meniscus, current injury, left knee, subsequent encounter: Secondary | ICD-10-CM | POA: Diagnosis not present

## 2020-11-17 DIAGNOSIS — S82142D Displaced bicondylar fracture of left tibia, subsequent encounter for closed fracture with routine healing: Secondary | ICD-10-CM | POA: Diagnosis not present

## 2020-11-17 DIAGNOSIS — S82112D Displaced fracture of left tibial spine, subsequent encounter for closed fracture with routine healing: Secondary | ICD-10-CM | POA: Diagnosis not present

## 2020-11-19 DIAGNOSIS — E559 Vitamin D deficiency, unspecified: Secondary | ICD-10-CM | POA: Diagnosis not present

## 2020-11-19 DIAGNOSIS — Z9181 History of falling: Secondary | ICD-10-CM | POA: Diagnosis not present

## 2020-11-19 DIAGNOSIS — R339 Retention of urine, unspecified: Secondary | ICD-10-CM | POA: Diagnosis not present

## 2020-11-19 DIAGNOSIS — Z7901 Long term (current) use of anticoagulants: Secondary | ICD-10-CM | POA: Diagnosis not present

## 2020-11-19 DIAGNOSIS — I10 Essential (primary) hypertension: Secondary | ICD-10-CM | POA: Diagnosis not present

## 2020-11-19 DIAGNOSIS — S82142D Displaced bicondylar fracture of left tibia, subsequent encounter for closed fracture with routine healing: Secondary | ICD-10-CM | POA: Diagnosis not present

## 2020-11-22 DIAGNOSIS — Z7901 Long term (current) use of anticoagulants: Secondary | ICD-10-CM | POA: Diagnosis not present

## 2020-11-22 DIAGNOSIS — R339 Retention of urine, unspecified: Secondary | ICD-10-CM | POA: Diagnosis not present

## 2020-11-22 DIAGNOSIS — I10 Essential (primary) hypertension: Secondary | ICD-10-CM | POA: Diagnosis not present

## 2020-11-22 DIAGNOSIS — S82142D Displaced bicondylar fracture of left tibia, subsequent encounter for closed fracture with routine healing: Secondary | ICD-10-CM | POA: Diagnosis not present

## 2020-11-22 DIAGNOSIS — E559 Vitamin D deficiency, unspecified: Secondary | ICD-10-CM | POA: Diagnosis not present

## 2020-11-22 DIAGNOSIS — Z9181 History of falling: Secondary | ICD-10-CM | POA: Diagnosis not present

## 2020-11-25 DIAGNOSIS — R339 Retention of urine, unspecified: Secondary | ICD-10-CM | POA: Diagnosis not present

## 2020-11-25 DIAGNOSIS — I10 Essential (primary) hypertension: Secondary | ICD-10-CM | POA: Diagnosis not present

## 2020-11-25 DIAGNOSIS — E559 Vitamin D deficiency, unspecified: Secondary | ICD-10-CM | POA: Diagnosis not present

## 2020-11-25 DIAGNOSIS — S82142D Displaced bicondylar fracture of left tibia, subsequent encounter for closed fracture with routine healing: Secondary | ICD-10-CM | POA: Diagnosis not present

## 2020-11-25 DIAGNOSIS — Z7901 Long term (current) use of anticoagulants: Secondary | ICD-10-CM | POA: Diagnosis not present

## 2020-11-25 DIAGNOSIS — Z9181 History of falling: Secondary | ICD-10-CM | POA: Diagnosis not present

## 2020-11-30 DIAGNOSIS — Z9181 History of falling: Secondary | ICD-10-CM | POA: Diagnosis not present

## 2020-11-30 DIAGNOSIS — Z7901 Long term (current) use of anticoagulants: Secondary | ICD-10-CM | POA: Diagnosis not present

## 2020-11-30 DIAGNOSIS — E559 Vitamin D deficiency, unspecified: Secondary | ICD-10-CM | POA: Diagnosis not present

## 2020-11-30 DIAGNOSIS — I10 Essential (primary) hypertension: Secondary | ICD-10-CM | POA: Diagnosis not present

## 2020-11-30 DIAGNOSIS — R339 Retention of urine, unspecified: Secondary | ICD-10-CM | POA: Diagnosis not present

## 2020-11-30 DIAGNOSIS — S82142D Displaced bicondylar fracture of left tibia, subsequent encounter for closed fracture with routine healing: Secondary | ICD-10-CM | POA: Diagnosis not present

## 2020-12-01 DIAGNOSIS — S83282D Other tear of lateral meniscus, current injury, left knee, subsequent encounter: Secondary | ICD-10-CM | POA: Diagnosis not present

## 2020-12-01 DIAGNOSIS — I10 Essential (primary) hypertension: Secondary | ICD-10-CM | POA: Diagnosis not present

## 2020-12-01 DIAGNOSIS — S82112D Displaced fracture of left tibial spine, subsequent encounter for closed fracture with routine healing: Secondary | ICD-10-CM | POA: Diagnosis not present

## 2020-12-01 DIAGNOSIS — S82142D Displaced bicondylar fracture of left tibia, subsequent encounter for closed fracture with routine healing: Secondary | ICD-10-CM | POA: Diagnosis not present

## 2020-12-17 DIAGNOSIS — S82102A Unspecified fracture of upper end of left tibia, initial encounter for closed fracture: Secondary | ICD-10-CM | POA: Diagnosis not present

## 2020-12-20 DIAGNOSIS — S82102A Unspecified fracture of upper end of left tibia, initial encounter for closed fracture: Secondary | ICD-10-CM | POA: Diagnosis not present

## 2020-12-22 DIAGNOSIS — S82142D Displaced bicondylar fracture of left tibia, subsequent encounter for closed fracture with routine healing: Secondary | ICD-10-CM | POA: Diagnosis not present

## 2020-12-22 DIAGNOSIS — S82102A Unspecified fracture of upper end of left tibia, initial encounter for closed fracture: Secondary | ICD-10-CM | POA: Diagnosis not present

## 2020-12-22 DIAGNOSIS — S82112D Displaced fracture of left tibial spine, subsequent encounter for closed fracture with routine healing: Secondary | ICD-10-CM | POA: Diagnosis not present

## 2020-12-24 DIAGNOSIS — S82102A Unspecified fracture of upper end of left tibia, initial encounter for closed fracture: Secondary | ICD-10-CM | POA: Diagnosis not present

## 2020-12-27 DIAGNOSIS — S82102A Unspecified fracture of upper end of left tibia, initial encounter for closed fracture: Secondary | ICD-10-CM | POA: Diagnosis not present

## 2020-12-28 DIAGNOSIS — S82102A Unspecified fracture of upper end of left tibia, initial encounter for closed fracture: Secondary | ICD-10-CM | POA: Diagnosis not present

## 2020-12-31 DIAGNOSIS — S82102A Unspecified fracture of upper end of left tibia, initial encounter for closed fracture: Secondary | ICD-10-CM | POA: Diagnosis not present

## 2021-01-04 DIAGNOSIS — S82102A Unspecified fracture of upper end of left tibia, initial encounter for closed fracture: Secondary | ICD-10-CM | POA: Diagnosis not present

## 2021-01-07 DIAGNOSIS — S82102A Unspecified fracture of upper end of left tibia, initial encounter for closed fracture: Secondary | ICD-10-CM | POA: Diagnosis not present

## 2021-01-11 DIAGNOSIS — S82102A Unspecified fracture of upper end of left tibia, initial encounter for closed fracture: Secondary | ICD-10-CM | POA: Diagnosis not present

## 2021-01-12 DIAGNOSIS — S82112D Displaced fracture of left tibial spine, subsequent encounter for closed fracture with routine healing: Secondary | ICD-10-CM | POA: Diagnosis not present

## 2021-01-12 DIAGNOSIS — S82142D Displaced bicondylar fracture of left tibia, subsequent encounter for closed fracture with routine healing: Secondary | ICD-10-CM | POA: Diagnosis not present

## 2021-01-13 DIAGNOSIS — S82102A Unspecified fracture of upper end of left tibia, initial encounter for closed fracture: Secondary | ICD-10-CM | POA: Diagnosis not present

## 2021-01-17 DIAGNOSIS — S82102A Unspecified fracture of upper end of left tibia, initial encounter for closed fracture: Secondary | ICD-10-CM | POA: Diagnosis not present

## 2021-01-18 DIAGNOSIS — S82102A Unspecified fracture of upper end of left tibia, initial encounter for closed fracture: Secondary | ICD-10-CM | POA: Diagnosis not present

## 2021-01-25 DIAGNOSIS — S82102A Unspecified fracture of upper end of left tibia, initial encounter for closed fracture: Secondary | ICD-10-CM | POA: Diagnosis not present

## 2021-01-27 DIAGNOSIS — S82102A Unspecified fracture of upper end of left tibia, initial encounter for closed fracture: Secondary | ICD-10-CM | POA: Diagnosis not present

## 2021-02-01 DIAGNOSIS — S82102A Unspecified fracture of upper end of left tibia, initial encounter for closed fracture: Secondary | ICD-10-CM | POA: Diagnosis not present

## 2021-02-03 DIAGNOSIS — S82102A Unspecified fracture of upper end of left tibia, initial encounter for closed fracture: Secondary | ICD-10-CM | POA: Diagnosis not present

## 2021-02-04 DIAGNOSIS — I10 Essential (primary) hypertension: Secondary | ICD-10-CM | POA: Diagnosis not present

## 2021-02-08 DIAGNOSIS — S82102A Unspecified fracture of upper end of left tibia, initial encounter for closed fracture: Secondary | ICD-10-CM | POA: Diagnosis not present

## 2021-02-09 DIAGNOSIS — S82112D Displaced fracture of left tibial spine, subsequent encounter for closed fracture with routine healing: Secondary | ICD-10-CM | POA: Diagnosis not present

## 2021-02-09 DIAGNOSIS — S82142D Displaced bicondylar fracture of left tibia, subsequent encounter for closed fracture with routine healing: Secondary | ICD-10-CM | POA: Diagnosis not present

## 2021-02-10 DIAGNOSIS — S82102A Unspecified fracture of upper end of left tibia, initial encounter for closed fracture: Secondary | ICD-10-CM | POA: Diagnosis not present

## 2021-02-14 DIAGNOSIS — S82102A Unspecified fracture of upper end of left tibia, initial encounter for closed fracture: Secondary | ICD-10-CM | POA: Diagnosis not present

## 2021-02-16 DIAGNOSIS — S82102A Unspecified fracture of upper end of left tibia, initial encounter for closed fracture: Secondary | ICD-10-CM | POA: Diagnosis not present

## 2021-02-22 DIAGNOSIS — S82102A Unspecified fracture of upper end of left tibia, initial encounter for closed fracture: Secondary | ICD-10-CM | POA: Diagnosis not present

## 2021-02-24 DIAGNOSIS — S82102A Unspecified fracture of upper end of left tibia, initial encounter for closed fracture: Secondary | ICD-10-CM | POA: Diagnosis not present

## 2021-03-01 DIAGNOSIS — S82102A Unspecified fracture of upper end of left tibia, initial encounter for closed fracture: Secondary | ICD-10-CM | POA: Diagnosis not present

## 2021-03-03 DIAGNOSIS — S82102A Unspecified fracture of upper end of left tibia, initial encounter for closed fracture: Secondary | ICD-10-CM | POA: Diagnosis not present

## 2021-03-08 DIAGNOSIS — S82102A Unspecified fracture of upper end of left tibia, initial encounter for closed fracture: Secondary | ICD-10-CM | POA: Diagnosis not present

## 2021-03-10 DIAGNOSIS — S82102A Unspecified fracture of upper end of left tibia, initial encounter for closed fracture: Secondary | ICD-10-CM | POA: Diagnosis not present

## 2021-03-15 DIAGNOSIS — S82102A Unspecified fracture of upper end of left tibia, initial encounter for closed fracture: Secondary | ICD-10-CM | POA: Diagnosis not present

## 2021-03-17 DIAGNOSIS — S82102A Unspecified fracture of upper end of left tibia, initial encounter for closed fracture: Secondary | ICD-10-CM | POA: Diagnosis not present

## 2021-03-22 DIAGNOSIS — S82102A Unspecified fracture of upper end of left tibia, initial encounter for closed fracture: Secondary | ICD-10-CM | POA: Diagnosis not present

## 2021-03-24 DIAGNOSIS — S82102A Unspecified fracture of upper end of left tibia, initial encounter for closed fracture: Secondary | ICD-10-CM | POA: Diagnosis not present

## 2021-03-29 DIAGNOSIS — S82102A Unspecified fracture of upper end of left tibia, initial encounter for closed fracture: Secondary | ICD-10-CM | POA: Diagnosis not present

## 2021-04-01 DIAGNOSIS — S82102A Unspecified fracture of upper end of left tibia, initial encounter for closed fracture: Secondary | ICD-10-CM | POA: Diagnosis not present

## 2021-04-06 DIAGNOSIS — S82102A Unspecified fracture of upper end of left tibia, initial encounter for closed fracture: Secondary | ICD-10-CM | POA: Diagnosis not present

## 2021-04-12 DIAGNOSIS — S82102A Unspecified fracture of upper end of left tibia, initial encounter for closed fracture: Secondary | ICD-10-CM | POA: Diagnosis not present

## 2021-04-19 DIAGNOSIS — S82102A Unspecified fracture of upper end of left tibia, initial encounter for closed fracture: Secondary | ICD-10-CM | POA: Diagnosis not present

## 2021-04-26 DIAGNOSIS — Z1211 Encounter for screening for malignant neoplasm of colon: Secondary | ICD-10-CM | POA: Diagnosis not present

## 2021-04-26 DIAGNOSIS — D120 Benign neoplasm of cecum: Secondary | ICD-10-CM | POA: Diagnosis not present

## 2021-04-26 DIAGNOSIS — D128 Benign neoplasm of rectum: Secondary | ICD-10-CM | POA: Diagnosis not present

## 2021-04-26 DIAGNOSIS — D123 Benign neoplasm of transverse colon: Secondary | ICD-10-CM | POA: Diagnosis not present

## 2021-04-26 DIAGNOSIS — D125 Benign neoplasm of sigmoid colon: Secondary | ICD-10-CM | POA: Diagnosis not present

## 2021-04-29 DIAGNOSIS — S82102A Unspecified fracture of upper end of left tibia, initial encounter for closed fracture: Secondary | ICD-10-CM | POA: Diagnosis not present

## 2021-05-04 DIAGNOSIS — K080 Exfoliation of teeth due to systemic causes: Secondary | ICD-10-CM | POA: Diagnosis not present

## 2021-05-05 DIAGNOSIS — K635 Polyp of colon: Secondary | ICD-10-CM | POA: Diagnosis not present

## 2021-05-11 DIAGNOSIS — S82142D Displaced bicondylar fracture of left tibia, subsequent encounter for closed fracture with routine healing: Secondary | ICD-10-CM | POA: Diagnosis not present

## 2021-05-11 DIAGNOSIS — S82112D Displaced fracture of left tibial spine, subsequent encounter for closed fracture with routine healing: Secondary | ICD-10-CM | POA: Diagnosis not present

## 2021-05-11 DIAGNOSIS — S83282D Other tear of lateral meniscus, current injury, left knee, subsequent encounter: Secondary | ICD-10-CM | POA: Diagnosis not present

## 2021-08-08 DIAGNOSIS — I10 Essential (primary) hypertension: Secondary | ICD-10-CM | POA: Diagnosis not present

## 2021-09-06 DIAGNOSIS — K635 Polyp of colon: Secondary | ICD-10-CM | POA: Diagnosis not present

## 2021-09-07 DIAGNOSIS — Z01818 Encounter for other preprocedural examination: Secondary | ICD-10-CM | POA: Diagnosis not present

## 2021-09-07 DIAGNOSIS — E782 Mixed hyperlipidemia: Secondary | ICD-10-CM | POA: Diagnosis not present

## 2021-09-07 DIAGNOSIS — E669 Obesity, unspecified: Secondary | ICD-10-CM | POA: Diagnosis not present

## 2021-09-07 DIAGNOSIS — N9989 Other postprocedural complications and disorders of genitourinary system: Secondary | ICD-10-CM | POA: Diagnosis not present

## 2021-09-15 DIAGNOSIS — D126 Benign neoplasm of colon, unspecified: Secondary | ICD-10-CM | POA: Diagnosis not present

## 2021-09-15 DIAGNOSIS — K635 Polyp of colon: Secondary | ICD-10-CM | POA: Diagnosis not present

## 2021-09-15 DIAGNOSIS — Q438 Other specified congenital malformations of intestine: Secondary | ICD-10-CM | POA: Diagnosis not present

## 2021-09-15 DIAGNOSIS — Z79899 Other long term (current) drug therapy: Secondary | ICD-10-CM | POA: Diagnosis not present

## 2021-09-15 DIAGNOSIS — G8918 Other acute postprocedural pain: Secondary | ICD-10-CM | POA: Diagnosis not present

## 2021-09-15 DIAGNOSIS — I10 Essential (primary) hypertension: Secondary | ICD-10-CM | POA: Diagnosis not present

## 2021-09-15 DIAGNOSIS — G4733 Obstructive sleep apnea (adult) (pediatric): Secondary | ICD-10-CM | POA: Diagnosis not present

## 2021-09-15 DIAGNOSIS — E669 Obesity, unspecified: Secondary | ICD-10-CM | POA: Diagnosis not present

## 2021-09-15 DIAGNOSIS — Z6833 Body mass index (BMI) 33.0-33.9, adult: Secondary | ICD-10-CM | POA: Diagnosis not present

## 2021-09-15 DIAGNOSIS — E559 Vitamin D deficiency, unspecified: Secondary | ICD-10-CM | POA: Diagnosis not present

## 2021-09-15 DIAGNOSIS — D120 Benign neoplasm of cecum: Secondary | ICD-10-CM | POA: Diagnosis not present

## 2021-09-15 DIAGNOSIS — D122 Benign neoplasm of ascending colon: Secondary | ICD-10-CM | POA: Diagnosis not present

## 2021-10-19 DIAGNOSIS — S83282D Other tear of lateral meniscus, current injury, left knee, subsequent encounter: Secondary | ICD-10-CM | POA: Diagnosis not present

## 2021-10-19 DIAGNOSIS — S82112D Displaced fracture of left tibial spine, subsequent encounter for closed fracture with routine healing: Secondary | ICD-10-CM | POA: Diagnosis not present

## 2021-10-19 DIAGNOSIS — S82142D Displaced bicondylar fracture of left tibia, subsequent encounter for closed fracture with routine healing: Secondary | ICD-10-CM | POA: Diagnosis not present

## 2022-02-22 DIAGNOSIS — Z125 Encounter for screening for malignant neoplasm of prostate: Secondary | ICD-10-CM | POA: Diagnosis not present

## 2022-02-22 DIAGNOSIS — Z131 Encounter for screening for diabetes mellitus: Secondary | ICD-10-CM | POA: Diagnosis not present

## 2022-02-22 DIAGNOSIS — R7301 Impaired fasting glucose: Secondary | ICD-10-CM | POA: Diagnosis not present

## 2022-02-22 DIAGNOSIS — E782 Mixed hyperlipidemia: Secondary | ICD-10-CM | POA: Diagnosis not present

## 2022-02-22 DIAGNOSIS — Z0000 Encounter for general adult medical examination without abnormal findings: Secondary | ICD-10-CM | POA: Diagnosis not present

## 2022-02-22 DIAGNOSIS — I10 Essential (primary) hypertension: Secondary | ICD-10-CM | POA: Diagnosis not present

## 2022-02-22 DIAGNOSIS — Z13220 Encounter for screening for lipoid disorders: Secondary | ICD-10-CM | POA: Diagnosis not present

## 2022-05-14 IMAGING — CT CT KNEE*L* W/O CM
3 series · 14 of 33 positions shown, 17 images · non-contrast
Comparison: Radiographs 10/30/2020

CLINICAL DATA: Tibial plateau fracture post external fixation.

EXAM:
CT OF THE LEFT KNEE WITHOUT CONTRAST
3-DIMENSIONAL CT IMAGE RENDERING ON ACQUISITION WORKSTATION
TECHNIQUE: Multidetector CT imaging of the left knee was performed according to
the standard protocol. Multiplanar CT image reconstructions were
also generated. 3-dimensional CT images were rendered by
post-processing of the original CT data on an acquisition
workstation. The 3-dimensional CT images were interpreted and
findings were reported in the accompanying complete CT report for
this study

[Series 4: lower ext 1.5 st · axial · 0.44mm/px · z∈[+768,+1018]mm · 6 of 218 slices shown, 8 images]
[im 34/218  soft-tissue]
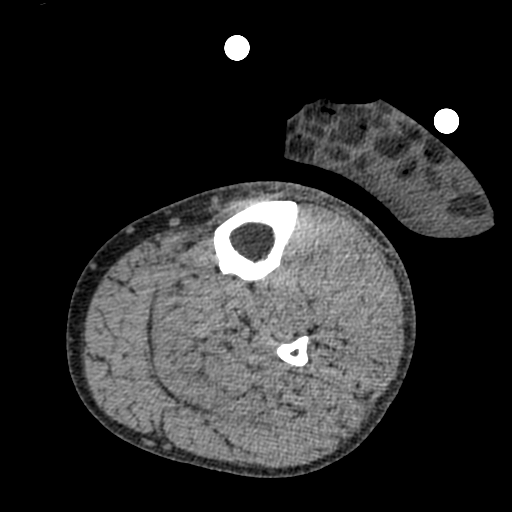
[im 34/218  bone]
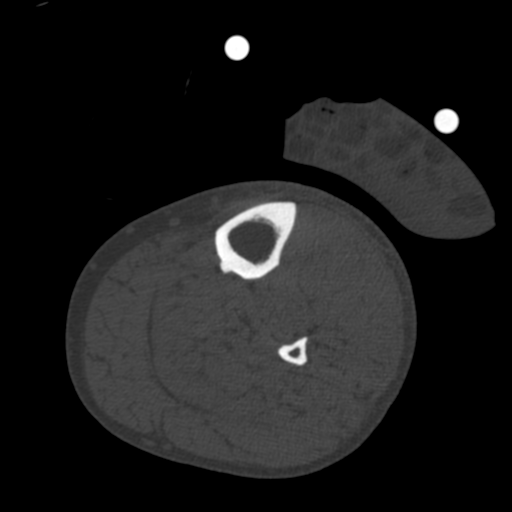
[im 67/218  bone]
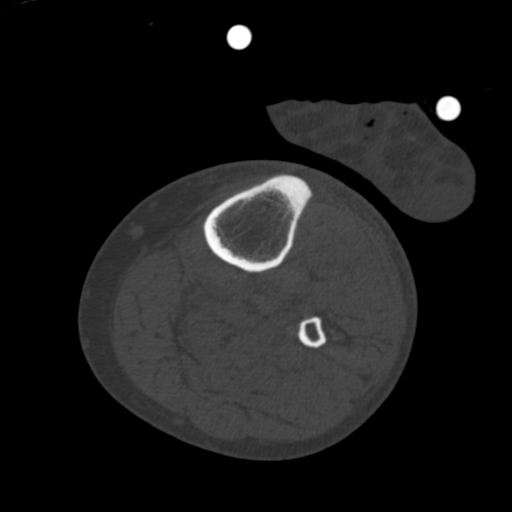
[im 101/218  bone]
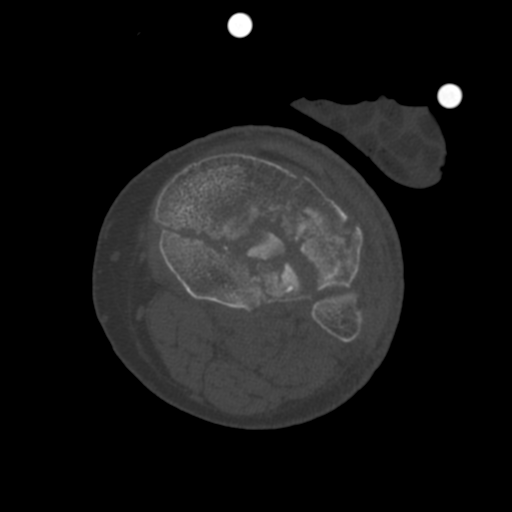
[im 134/218  bone]
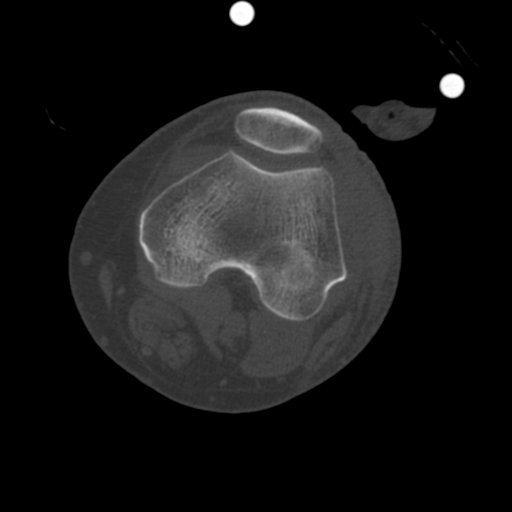
[im 167/218  soft-tissue]
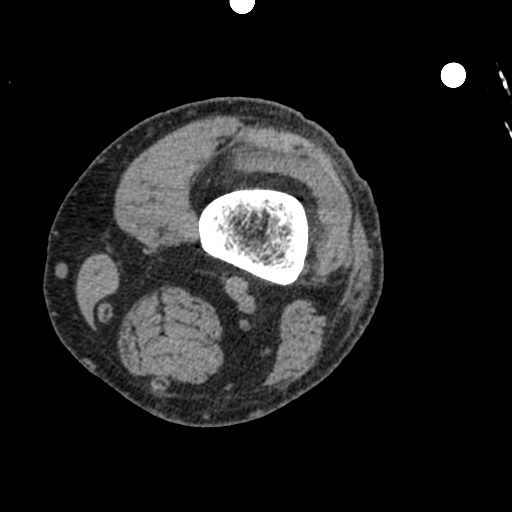
[im 167/218  bone]
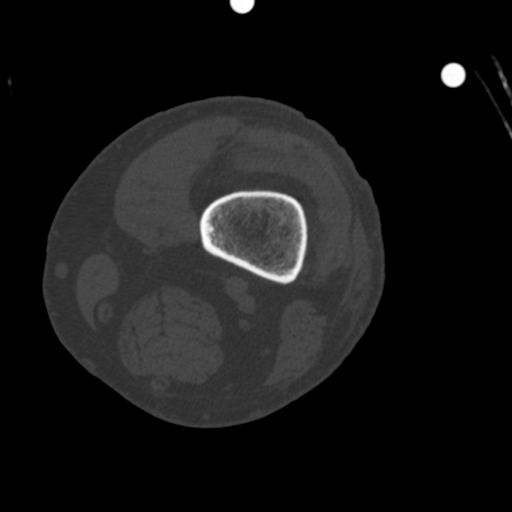
[im 201/218  bone]
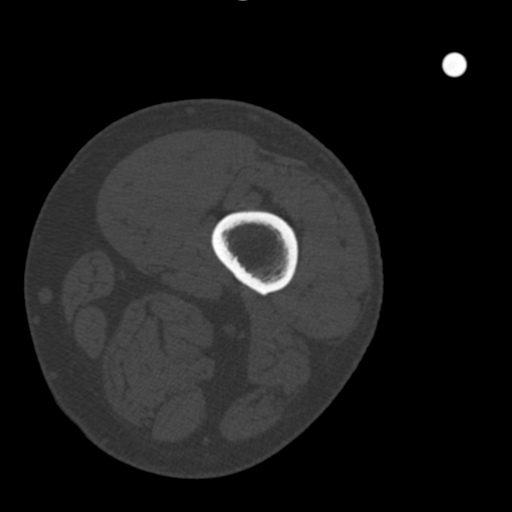

[Series 9: lower ext cor st · coronal · 0.46mm/px · 3 of 148 slices shown]
[im 30/148  bone]
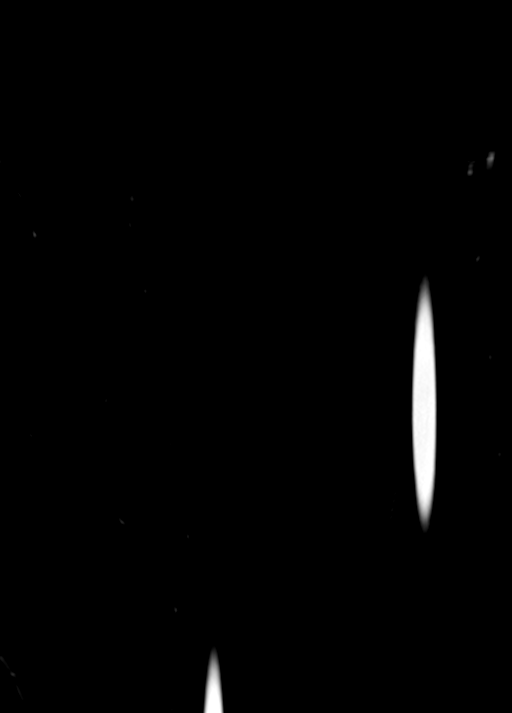
[im 59/148  bone]
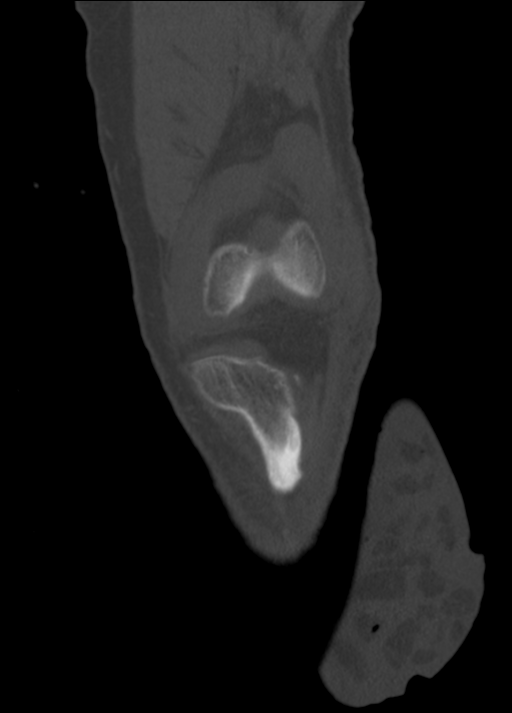
[im 89/148  bone]
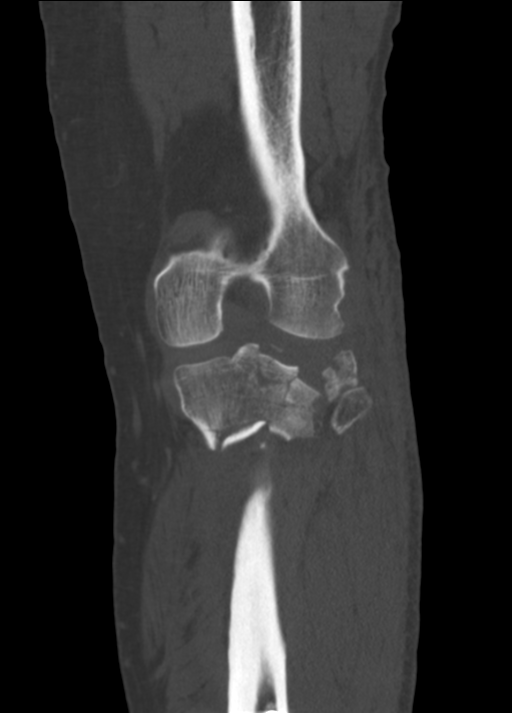

[Series 10: lower ext sag st · sagittal · 0.56mm/px · 5 of 132 slices shown, 6 images]
[im 44/132  bone]
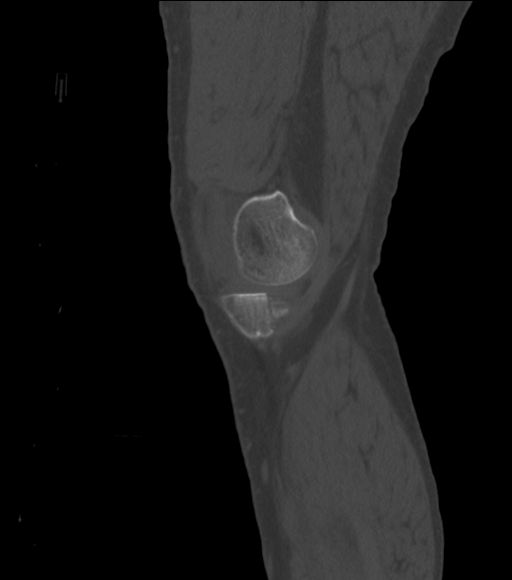
[im 55/132  bone]
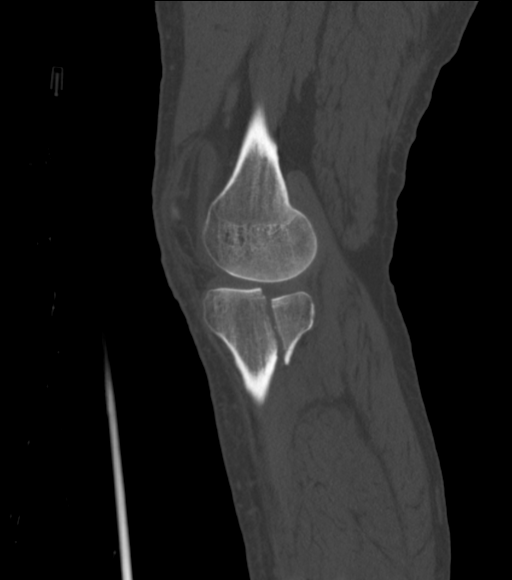
[im 66/132  soft-tissue]
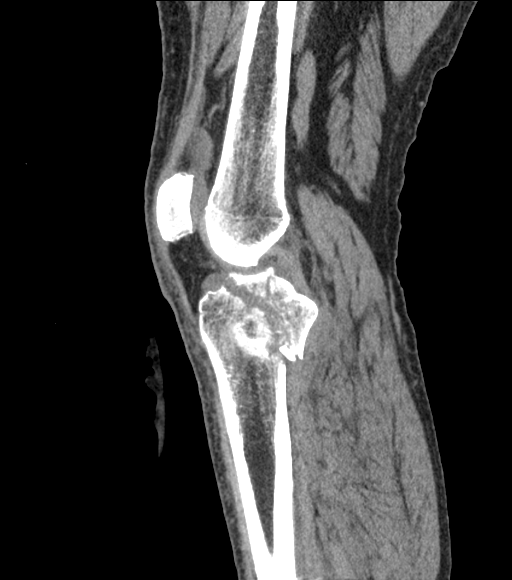
[im 66/132  bone]
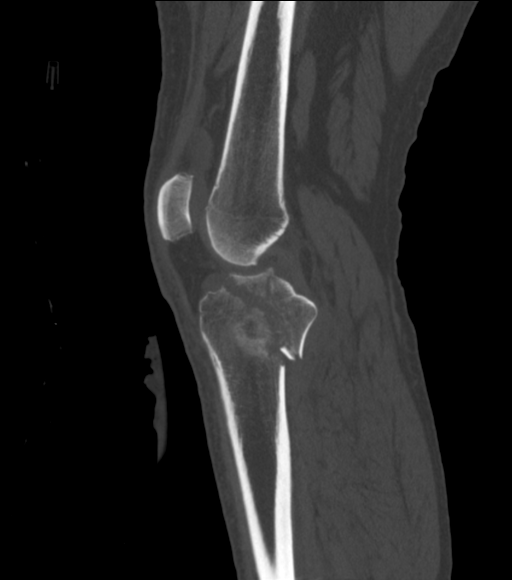
[im 77/132  bone]
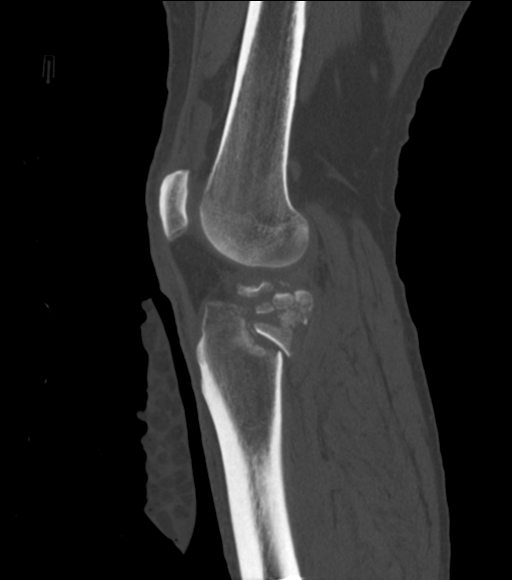
[im 88/132  bone]
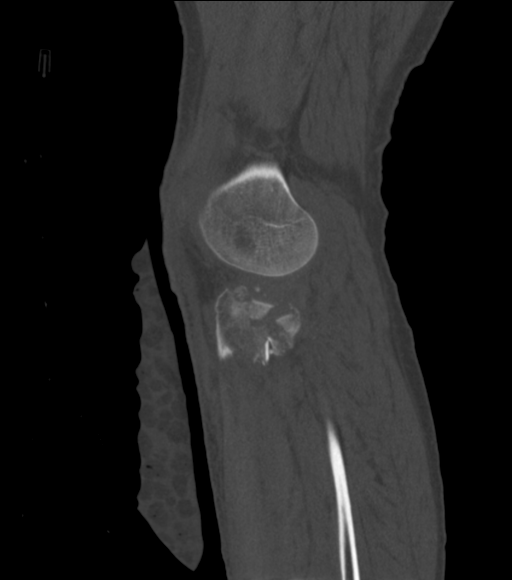

[14 of 33 positions shown; findings below may reference images not displayed]

FINDINGS: Bones/Joint/Cartilage

There is a markedly comminuted and depressed intra-articular
fracture of the proximal tibia. Components involving the medial
tibial plateau are relatively simple and situated in an oblique
coronal plane, resulting in 6 mm of depression of the articular
surface posteriorly. There are extensively comminuted and complex
components involving the lateral tibial plateau with significant
disruption of the articular surface. The articular surface is
depressed by at least 1.6 cm laterally, and many of the fracture
fragments are rotated. The tibial spines are undermined and
disrupted. Fractures extend into the proximal tibiofibular
articulation.

The proximal fibula is intact. No definite fracture of the distal
femur seen. There is a small fracture fragment adjacent to the
lateral femoral condyle which is presumably a fracture fragment in
the joint. The patella is intact.

Moderate size lipohemarthrosis is noted with several intra-articular
fracture fragments.

Ligaments

Suboptimally assessed by CT. The tibial spines are disrupted at the
ACL insertion.

Muscles and Tendons

Intact extensor mechanism.  No focal muscular abnormality.

Soft tissues

Relatively mild soft tissue swelling around the knee, greatest
anteriorly and laterally. No focal fluid collection, foreign body or
soft tissue emphysema.
IMPRESSION: 1. Markedly comminuted and depressed intra-articular fracture of the
proximal tibia as described. The articular surface of the lateral
tibial plateau is significantly disrupted and depressed.
2. The tibial spines are undermined and disrupted at the ACL
insertion.
3. Moderate size lipohemarthrosis with several intra-articular
fracture fragments.

## 2022-05-15 IMAGING — DX DG KNEE 1-2V PORT*L*
1 series · 2 of 2 positions shown · non-contrast
Comparison: X-ray left knee 10/31/2020

CLINICAL DATA: Status post left open reduction and internal
fixation

EXAM:
PORTABLE LEFT KNEE - 1-2 VIEW

[Series 1: knee · 0.14mm/px · 2 of 2 slices shown]
[im 1/2]
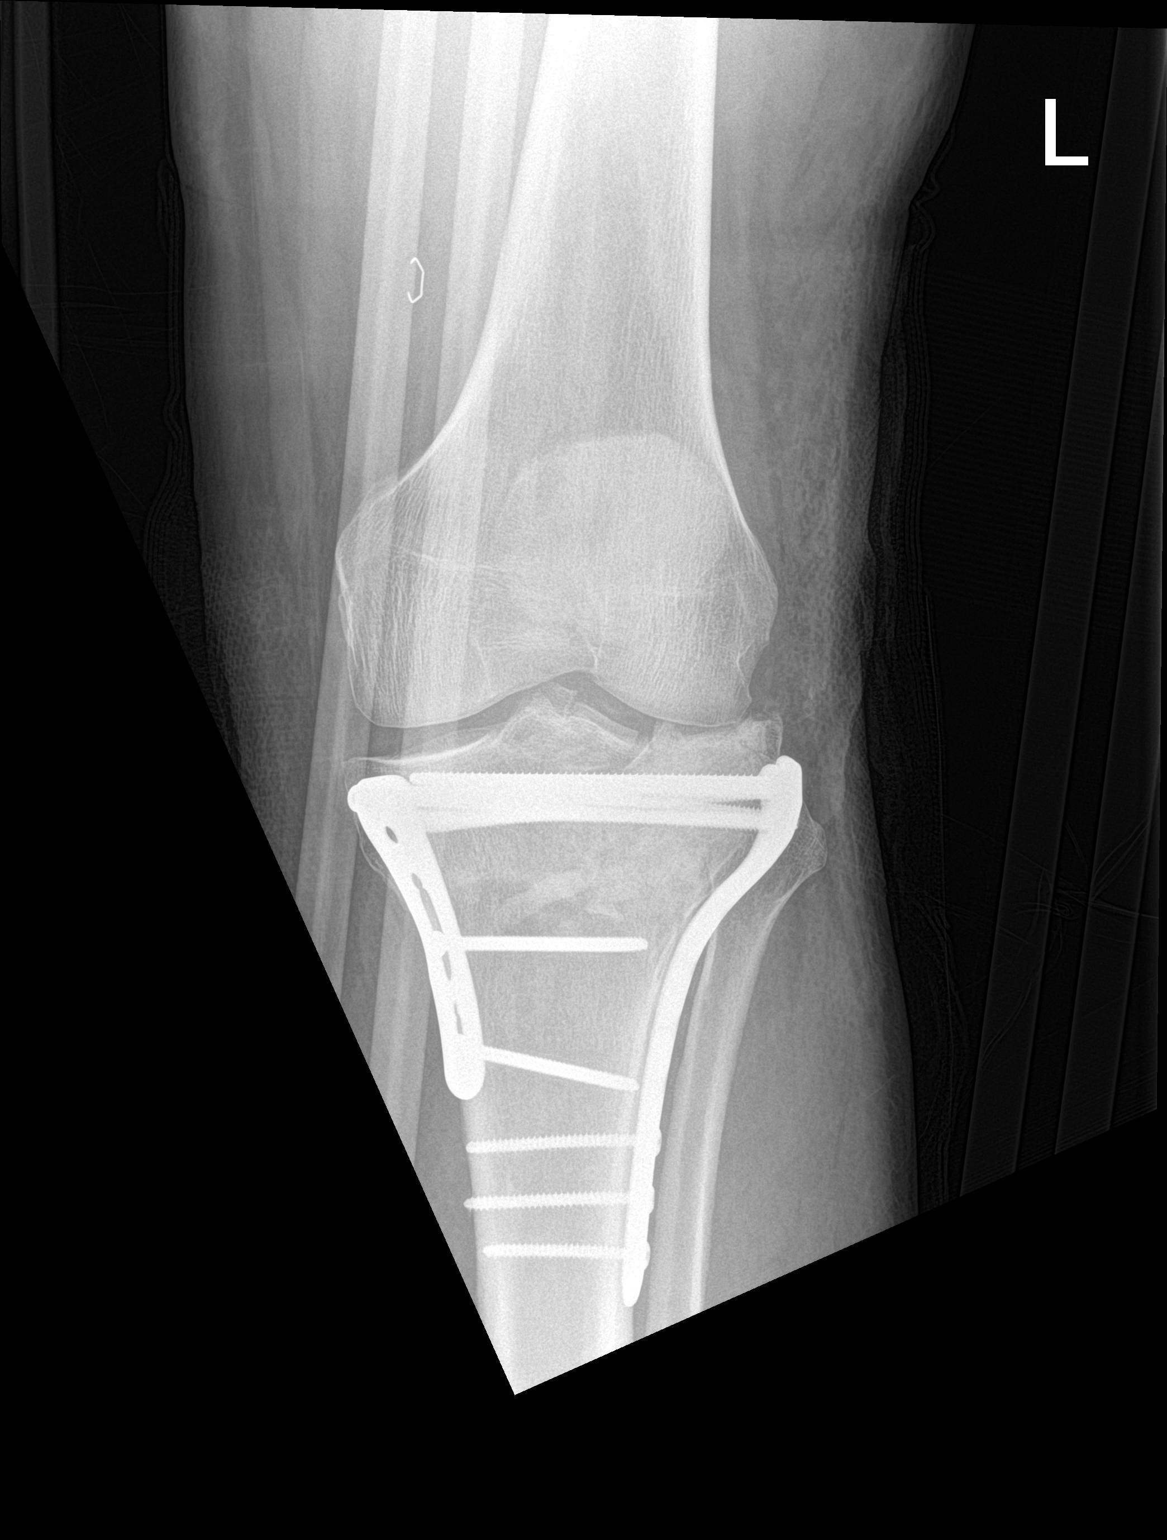
[im 2/2]
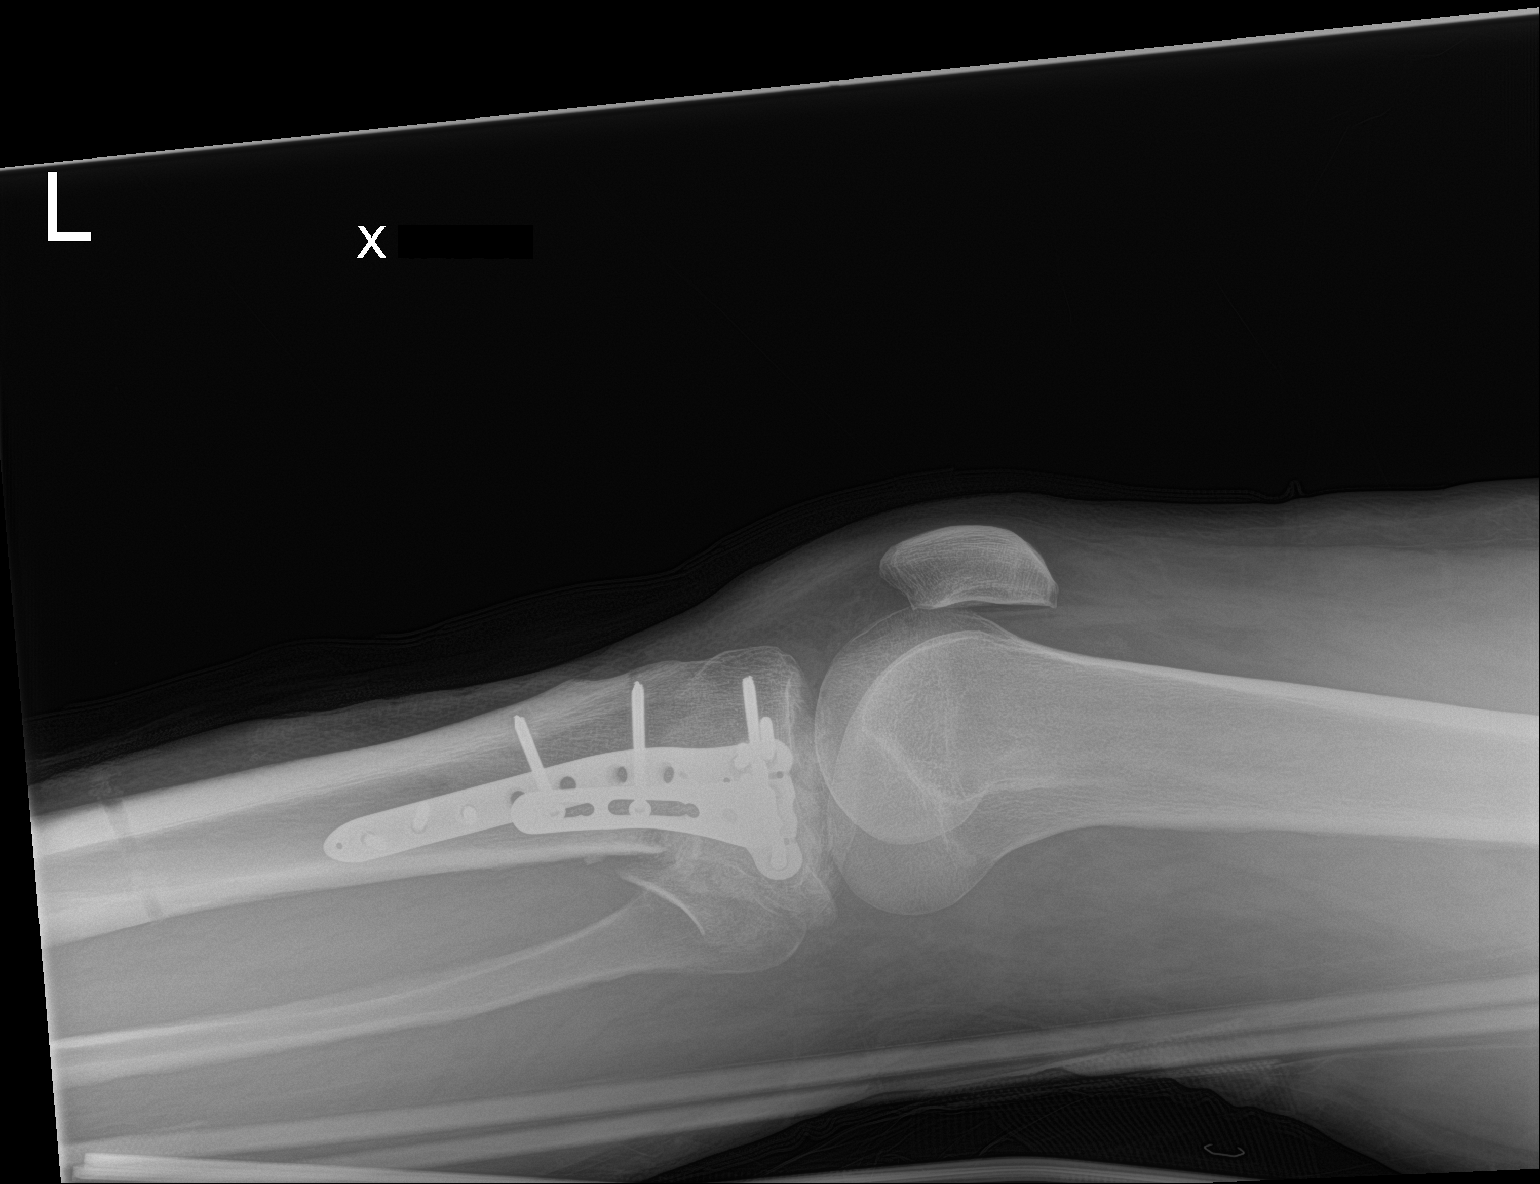

[2 of 2 positions shown; findings below may reference images not displayed]

FINDINGS: Status post open reduction internal fixation of a markedly
comminuted tibial plateau fracture with 2 plates and multiple screw.
No other acute displaced fracture identified. Persistent trace
lipohemarthrosis suggested.

Skin staple overlies the distal left thigh. External surgical
hardware noted.
IMPRESSION: Status post fixation of a markedly comminuted tibial plateau
fracture with 2 plates and multiple screw.

## 2022-06-23 DIAGNOSIS — D123 Benign neoplasm of transverse colon: Secondary | ICD-10-CM | POA: Diagnosis not present

## 2022-06-23 DIAGNOSIS — Z86010 Personal history of colonic polyps: Secondary | ICD-10-CM | POA: Diagnosis not present

## 2022-06-23 DIAGNOSIS — Z09 Encounter for follow-up examination after completed treatment for conditions other than malignant neoplasm: Secondary | ICD-10-CM | POA: Diagnosis not present

## 2022-06-23 DIAGNOSIS — Z1211 Encounter for screening for malignant neoplasm of colon: Secondary | ICD-10-CM | POA: Diagnosis not present

## 2022-06-23 DIAGNOSIS — D124 Benign neoplasm of descending colon: Secondary | ICD-10-CM | POA: Diagnosis not present

## 2022-07-18 DIAGNOSIS — L989 Disorder of the skin and subcutaneous tissue, unspecified: Secondary | ICD-10-CM | POA: Diagnosis not present

## 2022-07-18 DIAGNOSIS — Z23 Encounter for immunization: Secondary | ICD-10-CM | POA: Diagnosis not present

## 2023-03-14 DIAGNOSIS — I10 Essential (primary) hypertension: Secondary | ICD-10-CM | POA: Diagnosis not present

## 2023-03-14 DIAGNOSIS — Z125 Encounter for screening for malignant neoplasm of prostate: Secondary | ICD-10-CM | POA: Diagnosis not present

## 2023-03-14 DIAGNOSIS — R7303 Prediabetes: Secondary | ICD-10-CM | POA: Diagnosis not present

## 2023-03-14 DIAGNOSIS — E782 Mixed hyperlipidemia: Secondary | ICD-10-CM | POA: Diagnosis not present

## 2023-03-23 DIAGNOSIS — E1165 Type 2 diabetes mellitus with hyperglycemia: Secondary | ICD-10-CM | POA: Diagnosis not present

## 2023-03-23 DIAGNOSIS — I10 Essential (primary) hypertension: Secondary | ICD-10-CM | POA: Diagnosis not present

## 2023-03-23 DIAGNOSIS — Z0000 Encounter for general adult medical examination without abnormal findings: Secondary | ICD-10-CM | POA: Diagnosis not present

## 2023-03-23 DIAGNOSIS — E782 Mixed hyperlipidemia: Secondary | ICD-10-CM | POA: Diagnosis not present

## 2023-06-07 DIAGNOSIS — E1165 Type 2 diabetes mellitus with hyperglycemia: Secondary | ICD-10-CM | POA: Diagnosis not present

## 2023-10-17 DIAGNOSIS — E782 Mixed hyperlipidemia: Secondary | ICD-10-CM | POA: Diagnosis not present

## 2023-10-17 DIAGNOSIS — E1165 Type 2 diabetes mellitus with hyperglycemia: Secondary | ICD-10-CM | POA: Diagnosis not present

## 2024-04-02 DIAGNOSIS — E782 Mixed hyperlipidemia: Secondary | ICD-10-CM | POA: Diagnosis not present

## 2024-04-02 DIAGNOSIS — I10 Essential (primary) hypertension: Secondary | ICD-10-CM | POA: Diagnosis not present

## 2024-04-02 DIAGNOSIS — E1165 Type 2 diabetes mellitus with hyperglycemia: Secondary | ICD-10-CM | POA: Diagnosis not present

## 2024-04-02 DIAGNOSIS — Z0000 Encounter for general adult medical examination without abnormal findings: Secondary | ICD-10-CM | POA: Diagnosis not present

## 2024-04-02 DIAGNOSIS — Z23 Encounter for immunization: Secondary | ICD-10-CM | POA: Diagnosis not present

## 2024-04-02 DIAGNOSIS — Z125 Encounter for screening for malignant neoplasm of prostate: Secondary | ICD-10-CM | POA: Diagnosis not present
